# Patient Record
Sex: Female | Born: 1980 | Race: White | Hispanic: No | Marital: Single | Smoking: Never smoker
Health system: Southern US, Community
[De-identification: ages and names within clinical notes are randomized; demographics above are authoritative.]

## PROBLEM LIST (undated history)

## (undated) ENCOUNTER — Inpatient Hospital Stay (HOSPITAL_COMMUNITY): Payer: Self-pay

## (undated) DIAGNOSIS — B009 Herpesviral infection, unspecified: Secondary | ICD-10-CM

## (undated) DIAGNOSIS — Z8759 Personal history of other complications of pregnancy, childbirth and the puerperium: Secondary | ICD-10-CM

## (undated) DIAGNOSIS — O4400 Placenta previa specified as without hemorrhage, unspecified trimester: Secondary | ICD-10-CM

## (undated) DIAGNOSIS — R87629 Unspecified abnormal cytological findings in specimens from vagina: Secondary | ICD-10-CM

## (undated) DIAGNOSIS — N200 Calculus of kidney: Secondary | ICD-10-CM

## (undated) HISTORY — DX: Unspecified abnormal cytological findings in specimens from vagina: R87.629

## (undated) HISTORY — DX: Personal history of other complications of pregnancy, childbirth and the puerperium: Z87.59

## (undated) HISTORY — DX: Herpesviral infection, unspecified: B00.9

## (undated) HISTORY — PX: LEEP: SHX91

## (undated) HISTORY — DX: Calculus of kidney: N20.0

---
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
## 2002-08-01 ENCOUNTER — Ambulatory Visit (HOSPITAL_COMMUNITY): Admission: RE | Admit: 2002-08-01 | Discharge: 2002-08-01 | Payer: Self-pay

## 2002-08-01 ENCOUNTER — Encounter (INDEPENDENT_AMBULATORY_CARE_PROVIDER_SITE_OTHER): Payer: Self-pay

## 2003-01-02 ENCOUNTER — Other Ambulatory Visit: Admission: RE | Admit: 2003-01-02 | Discharge: 2003-01-02 | Payer: Self-pay

## 2003-04-16 ENCOUNTER — Encounter: Admission: RE | Admit: 2003-04-16 | Discharge: 2003-04-16 | Payer: Self-pay | Admitting: Family Medicine

## 2003-05-16 ENCOUNTER — Other Ambulatory Visit: Admission: RE | Admit: 2003-05-16 | Discharge: 2003-05-16 | Payer: Self-pay | Admitting: Obstetrics and Gynecology

## 2003-07-12 ENCOUNTER — Emergency Department (HOSPITAL_COMMUNITY): Admission: EM | Admit: 2003-07-12 | Discharge: 2003-07-12 | Payer: Self-pay | Admitting: Emergency Medicine

## 2003-09-13 ENCOUNTER — Other Ambulatory Visit: Admission: RE | Admit: 2003-09-13 | Discharge: 2003-09-13 | Payer: Self-pay | Admitting: Obstetrics and Gynecology

## 2004-04-28 ENCOUNTER — Other Ambulatory Visit: Admission: RE | Admit: 2004-04-28 | Discharge: 2004-04-28 | Payer: Self-pay | Admitting: Obstetrics and Gynecology

## 2005-04-30 ENCOUNTER — Other Ambulatory Visit: Admission: RE | Admit: 2005-04-30 | Discharge: 2005-04-30 | Payer: Self-pay | Admitting: Obstetrics and Gynecology

## 2006-04-24 ENCOUNTER — Emergency Department (HOSPITAL_COMMUNITY): Admission: EM | Admit: 2006-04-24 | Discharge: 2006-04-24 | Payer: Self-pay | Admitting: Emergency Medicine

## 2006-05-13 ENCOUNTER — Other Ambulatory Visit: Admission: RE | Admit: 2006-05-13 | Discharge: 2006-05-13 | Payer: Self-pay | Admitting: Obstetrics and Gynecology

## 2007-04-29 ENCOUNTER — Other Ambulatory Visit: Admission: RE | Admit: 2007-04-29 | Discharge: 2007-04-29 | Payer: Self-pay | Admitting: Obstetrics and Gynecology

## 2007-12-08 IMAGING — CT CT HEAD W/O CM
1 of 2 series · 16 of 30 positions shown, 20 images · IV contrast (agent unspecified)
Comparison: none

CLINICAL DATA: Fell hitting head with hematoma in the right occipital region. 
 HEAD CT WITHOUT CONTRAST:
TECHNIQUE: Contiguous axial images were obtained from the base of the skull through the vertex according to standard protocol without contrast.

[Series 3: recon 2: brain · axial · 0.47mm/px · z∈[+91,+216]mm · 16 of 56 slices shown, 20 images]
[im 3/56  brain]
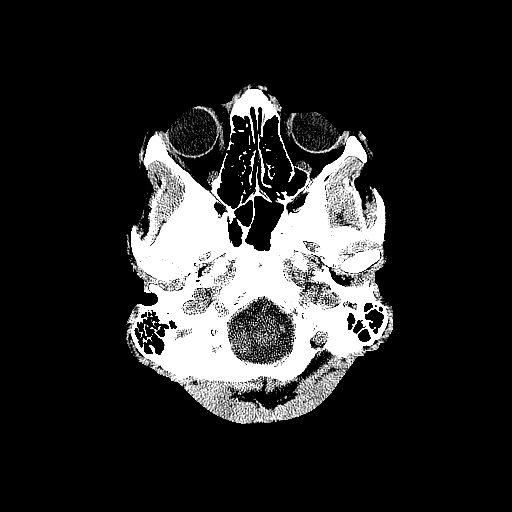
[im 3/56  bone]
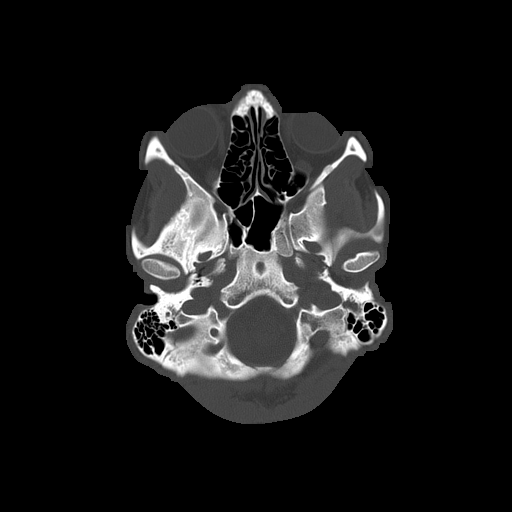
[im 6/56  brain]
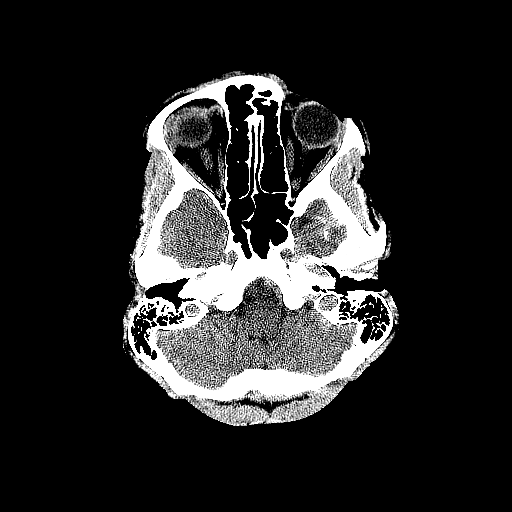
[im 9/56  brain]
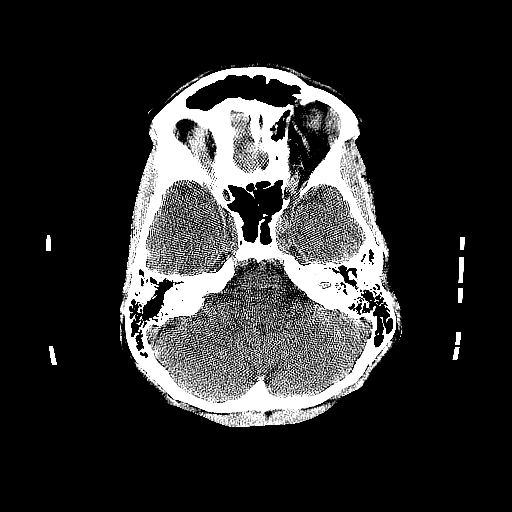
[im 12/56  brain]
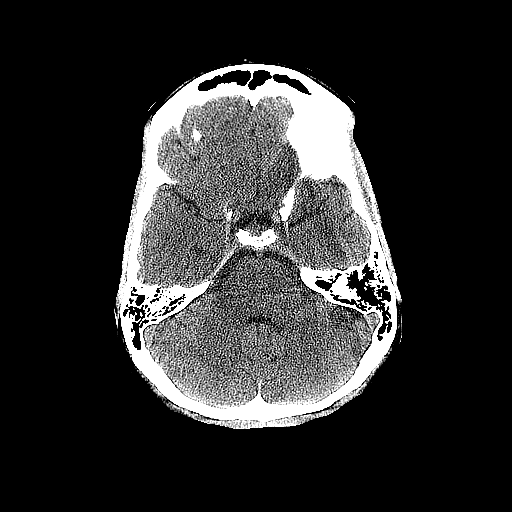
[im 18/56  brain]
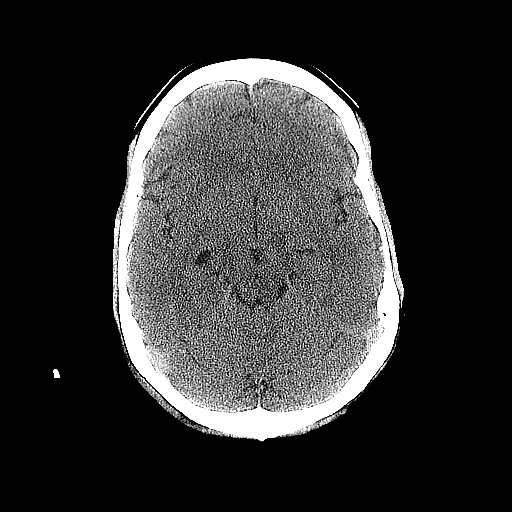
[im 18/56  bone]
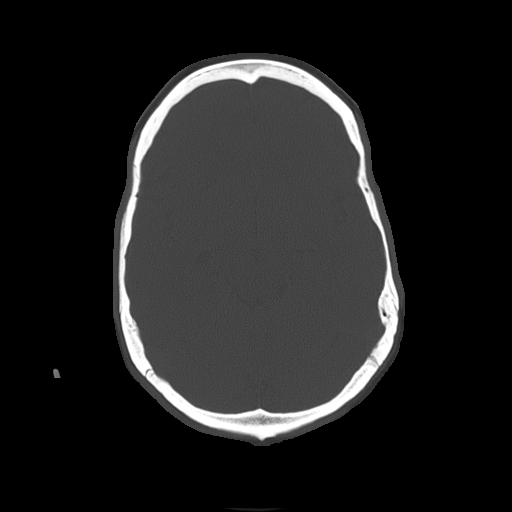
[im 21/56  brain]
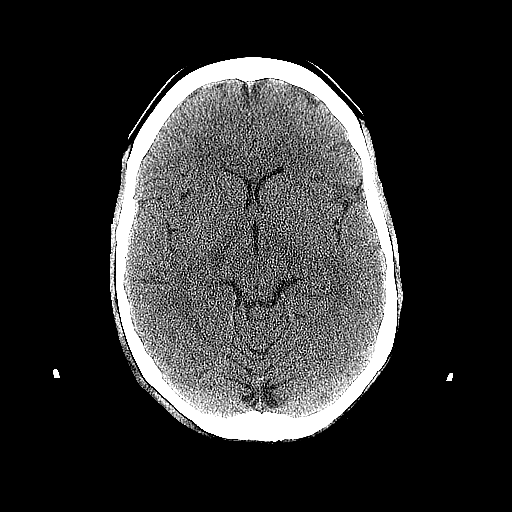
[im 24/56  brain]
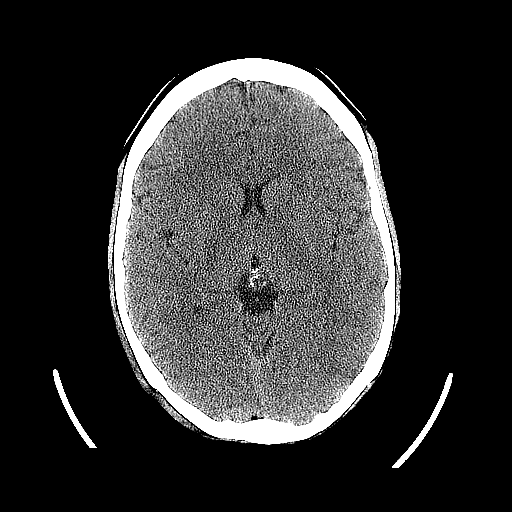
[im 27/56  brain]
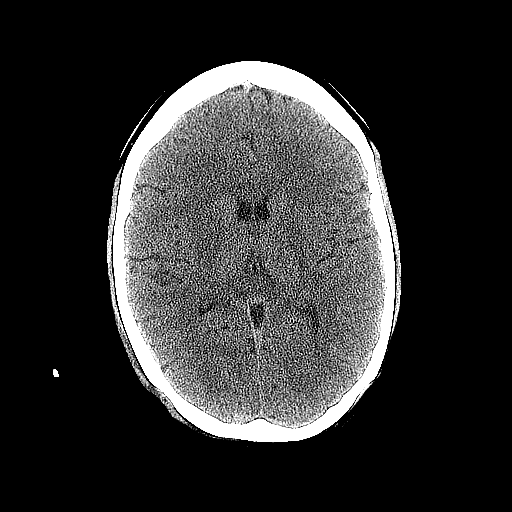
[im 29/56  brain]
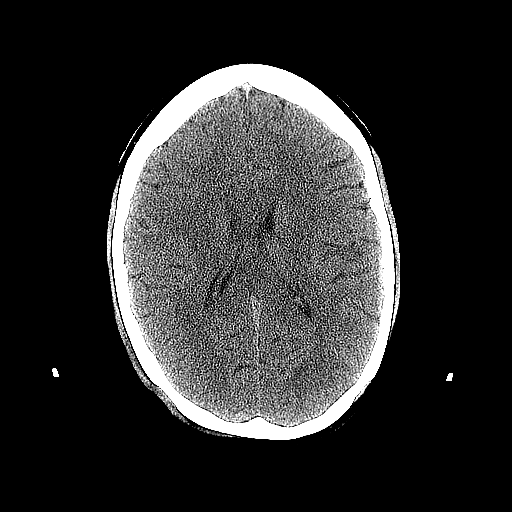
[im 29/56  bone]
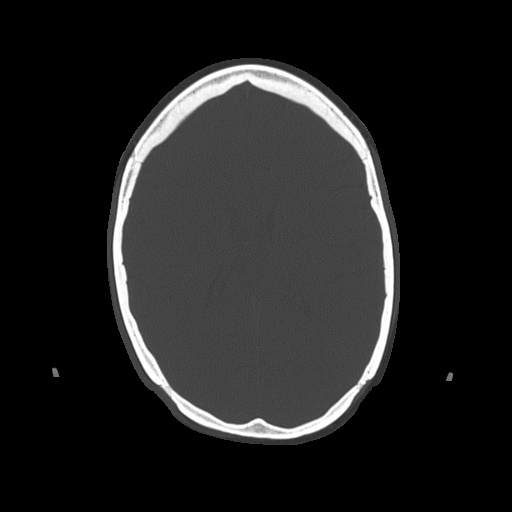
[im 32/56  brain]
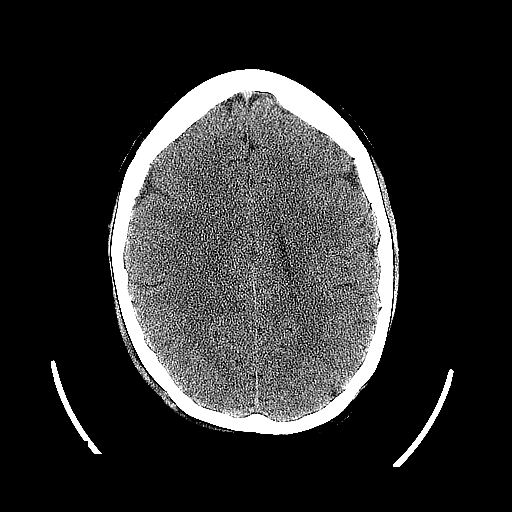
[im 35/56  brain]
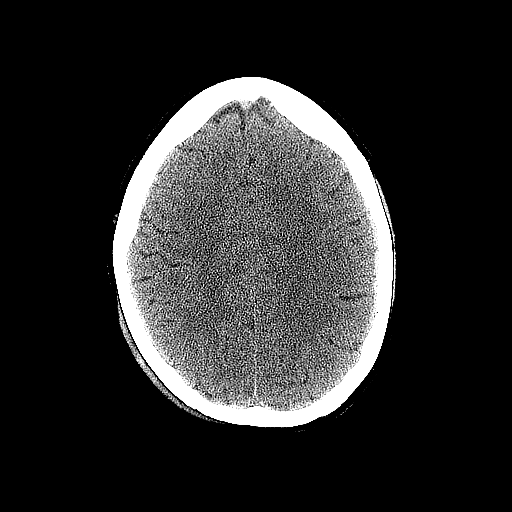
[im 38/56  brain]
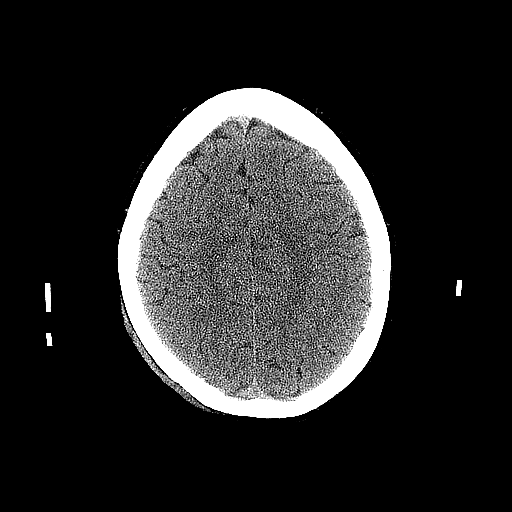
[im 44/56  brain]
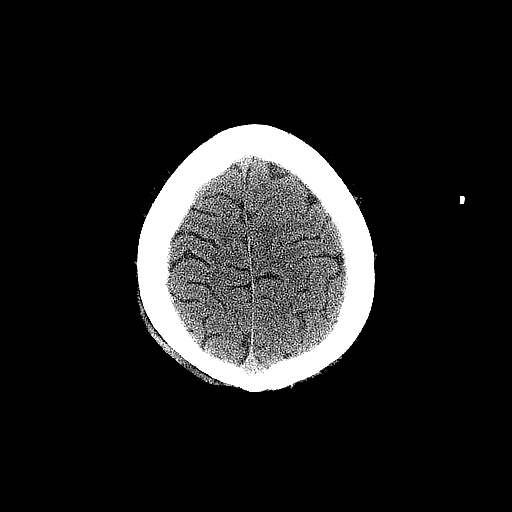
[im 44/56  bone]
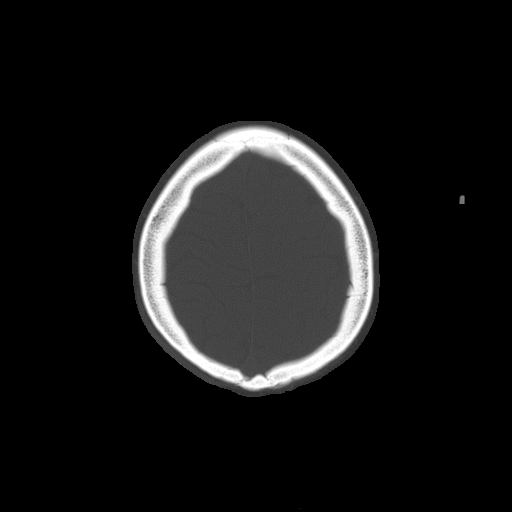
[im 47/56  brain]
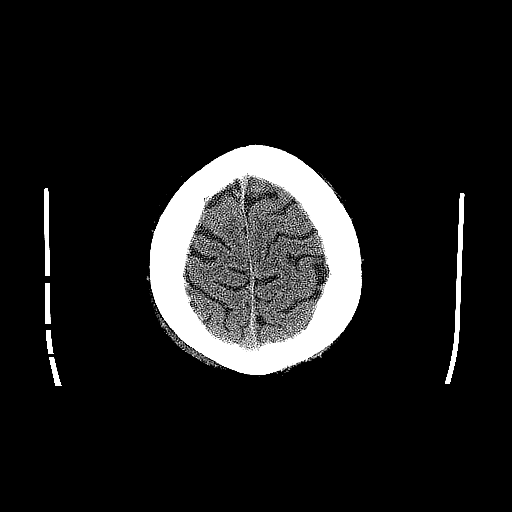
[im 50/56  brain]
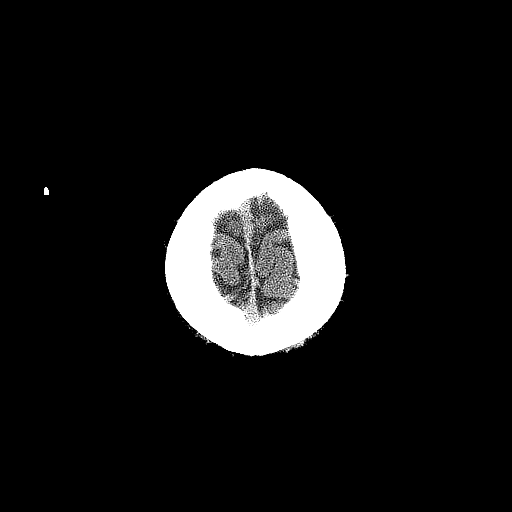
[im 53/56  brain]
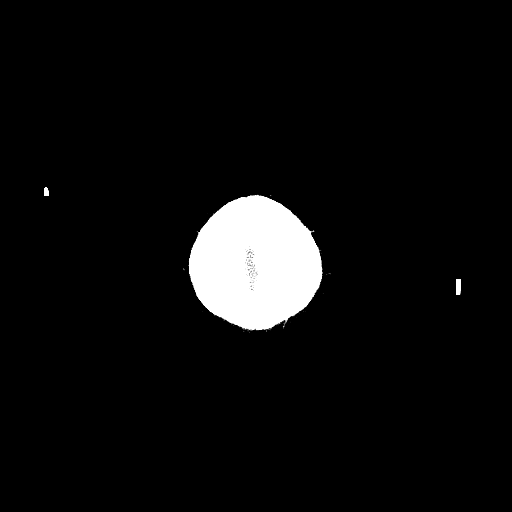

[16 of 30 positions shown; findings below may reference images not displayed]

FINDINGS: The ventricular system is normal in size and configuration and the septum is in a normal midline position. The 4th ventricle and basilar cisterns appear normal. No blood, edema, or mass effect is seen. On bone window images, no bony abnormality is noted.
IMPRESSION: No acute intracranial abnormality.

## 2009-05-03 ENCOUNTER — Other Ambulatory Visit: Admission: RE | Admit: 2009-05-03 | Discharge: 2009-05-03 | Payer: Self-pay | Admitting: Obstetrics and Gynecology

## 2009-11-11 ENCOUNTER — Ambulatory Visit (HOSPITAL_COMMUNITY): Admission: AD | Admit: 2009-11-11 | Discharge: 2009-11-11 | Payer: Self-pay | Admitting: Obstetrics and Gynecology

## 2009-11-15 ENCOUNTER — Inpatient Hospital Stay (HOSPITAL_COMMUNITY): Admission: AD | Admit: 2009-11-15 | Discharge: 2009-11-18 | Payer: Self-pay | Admitting: Obstetrics and Gynecology

## 2009-11-29 ENCOUNTER — Ambulatory Visit: Admission: RE | Admit: 2009-11-29 | Discharge: 2009-11-29 | Payer: Self-pay | Admitting: Obstetrics and Gynecology

## 2010-06-14 ENCOUNTER — Encounter: Payer: Self-pay | Admitting: *Deleted

## 2010-07-17 ENCOUNTER — Other Ambulatory Visit (HOSPITAL_COMMUNITY)
Admission: RE | Admit: 2010-07-17 | Discharge: 2010-07-17 | Disposition: A | Discharge status: Home / Self Care | Payer: Medicaid Other | Source: Ambulatory Visit | Attending: Obstetrics and Gynecology | Admitting: Obstetrics and Gynecology

## 2010-07-17 ENCOUNTER — Other Ambulatory Visit: Payer: Self-pay | Admitting: Obstetrics and Gynecology

## 2010-07-17 DIAGNOSIS — Z01419 Encounter for gynecological examination (general) (routine) without abnormal findings: Secondary | ICD-10-CM | POA: Insufficient documentation

## 2010-07-17 DIAGNOSIS — Z113 Encounter for screening for infections with a predominantly sexual mode of transmission: Secondary | ICD-10-CM | POA: Insufficient documentation

## 2010-08-10 LAB — COMPREHENSIVE METABOLIC PANEL WITH GFR
ALT: 24 U/L (ref 0–35)
AST: 37 U/L (ref 0–37)
Albumin: 2.3 g/dL — ABNORMAL LOW (ref 3.5–5.2)
Alkaline Phosphatase: 275 U/L — ABNORMAL HIGH (ref 39–117)
BUN: 11 mg/dL (ref 6–23)
CO2: 22 meq/L (ref 19–32)
Calcium: 8 mg/dL — ABNORMAL LOW (ref 8.4–10.5)
Chloride: 106 meq/L (ref 96–112)
Creatinine, Ser: 0.98 mg/dL (ref 0.4–1.2)
GFR calc Af Amer: >60 mL/min (ref >60)
GFR calc non Af Amer: >60 mL/min (ref >60)
Glucose, Bld: 81 mg/dL (ref 70–99)
Potassium: 3.9 meq/L (ref 3.5–5.1)
Sodium: 136 meq/L (ref 135–145)
Total Bilirubin: 0.5 mg/dL (ref 0.3–1.2)
Total Protein: 5.6 g/dL — ABNORMAL LOW (ref 6.0–8.3)

## 2010-08-10 LAB — LACTATE DEHYDROGENASE
LDH: 175 U/L (ref 94–250)
LDH: 179 U/L (ref 94–250)

## 2010-08-10 LAB — CBC
HCT: 37.9 % (ref 36.0–46.0)
HCT: 38.1 % (ref 36.0–46.0)
HCT: 38.5 % (ref 36.0–46.0)
HCT: 38.8 % (ref 36.0–46.0)
HCT: 40 % (ref 36.0–46.0)
Hemoglobin: 13.2 g/dL (ref 12.0–15.0)
Hemoglobin: 13.4 g/dL (ref 12.0–15.0)
Hemoglobin: 13.5 g/dL (ref 12.0–15.0)
Hemoglobin: 14.1 g/dL (ref 12.0–15.0)
MCH: 34.2 pg — ABNORMAL HIGH (ref 26.0–34.0)
MCH: 34.3 pg — ABNORMAL HIGH (ref 26.0–34.0)
MCH: 34.5 pg — ABNORMAL HIGH (ref 26.0–34.0)
MCHC: 35 g/dL (ref 30.0–36.0)
MCHC: 35 g/dL (ref 30.0–36.0)
MCHC: 35.2 g/dL (ref 30.0–36.0)
MCHC: 35.3 g/dL (ref 30.0–36.0)
MCV: 96.9 fL (ref 78.0–100.0)
MCV: 97.7 fL (ref 78.0–100.0)
MCV: 97.9 fL (ref 78.0–100.0)
MCV: 98 fL (ref 78.0–100.0)
Platelets: 142 10*3/uL — ABNORMAL LOW (ref 150–400)
Platelets: 144 K/uL — ABNORMAL LOW (ref 150–400)
Platelets: 150 K/uL (ref 150–400)
Platelets: 155 10*3/uL (ref 150–400)
RBC: 3.89 MIL/uL (ref 3.87–5.11)
RBC: 3.89 MIL/uL (ref 3.87–5.11)
RBC: 3.94 MIL/uL (ref 3.87–5.11)
RBC: 4.13 MIL/uL (ref 3.87–5.11)
RDW: 13.4 % (ref 11.5–15.5)
RDW: 13.5 % (ref 11.5–15.5)
RDW: 13.5 % (ref 11.5–15.5)
RDW: 13.6 % (ref 11.5–15.5)
WBC: 12.1 K/uL — ABNORMAL HIGH (ref 4.0–10.5)
WBC: 19.3 10*3/uL — ABNORMAL HIGH (ref 4.0–10.5)
WBC: 22.4 K/uL — ABNORMAL HIGH (ref 4.0–10.5)
WBC: 9.4 10*3/uL (ref 4.0–10.5)

## 2010-08-10 LAB — URINALYSIS, ROUTINE W REFLEX MICROSCOPIC
Bilirubin Urine: NEGATIVE
Glucose, UA: NEGATIVE mg/dL
Ketones, ur: NEGATIVE mg/dL
Leukocytes, UA: NEGATIVE
Nitrite: NEGATIVE
Protein, ur: 30 mg/dL — AB
Specific Gravity, Urine: 1.015 (ref 1.005–1.030)
Urobilinogen, UA: 0.2 mg/dL (ref 0.0–1.0)
pH: 6 (ref 5.0–8.0)

## 2010-08-10 LAB — COMPREHENSIVE METABOLIC PANEL
ALT: 20 U/L (ref 0–35)
AST: 33 U/L (ref 0–37)
Albumin: 2.3 g/dL — ABNORMAL LOW (ref 3.5–5.2)
Albumin: 2.5 g/dL — ABNORMAL LOW (ref 3.5–5.2)
Alkaline Phosphatase: 241 U/L — ABNORMAL HIGH (ref 39–117)
Alkaline Phosphatase: 279 U/L — ABNORMAL HIGH (ref 39–117)
Alkaline Phosphatase: 289 U/L — ABNORMAL HIGH (ref 39–117)
BUN: 11 mg/dL (ref 6–23)
BUN: 5 mg/dL — ABNORMAL LOW (ref 6–23)
CO2: 21 mEq/L (ref 19–32)
CO2: 23 mEq/L (ref 19–32)
CO2: 25 mEq/L (ref 19–32)
Calcium: 9.6 mg/dL (ref 8.4–10.5)
Chloride: 103 mEq/L (ref 96–112)
Chloride: 105 mEq/L (ref 96–112)
Chloride: 106 mEq/L (ref 96–112)
Creatinine, Ser: 1.02 mg/dL (ref 0.4–1.2)
GFR calc Af Amer: >60 mL/min (ref >60)
GFR calc non Af Amer: >60 mL/min (ref >60)
GFR calc non Af Amer: >60 mL/min (ref >60)
GFR calc non Af Amer: >60 mL/min (ref >60)
Glucose, Bld: 82 mg/dL (ref 70–99)
Glucose, Bld: 90 mg/dL (ref 70–99)
Potassium: 4.1 mEq/L (ref 3.5–5.1)
Potassium: 4.3 mEq/L (ref 3.5–5.1)
Sodium: 135 mEq/L (ref 135–145)
Total Bilirubin: 0.5 mg/dL (ref 0.3–1.2)
Total Bilirubin: 0.5 mg/dL (ref 0.3–1.2)
Total Bilirubin: 0.6 mg/dL (ref 0.3–1.2)

## 2010-08-10 LAB — URINE MICROSCOPIC-ADD ON

## 2010-08-10 LAB — MRSA PCR SCREENING: MRSA by PCR: NEGATIVE

## 2010-08-10 LAB — MAGNESIUM
Magnesium: 5 mg/dL — ABNORMAL HIGH (ref 1.5–2.5)
Magnesium: 6.8 mg/dL (ref 1.5–2.5)

## 2010-08-10 LAB — URIC ACID
Uric Acid, Serum: 7.1 mg/dL — ABNORMAL HIGH (ref 2.4–7.0)
Uric Acid, Serum: 7.9 mg/dL — ABNORMAL HIGH (ref 2.4–7.0)

## 2010-10-10 NOTE — Discharge Summary (Signed)
[facility]  Patient:    Tanya Gregory, Tanya Gregory                       MRN: [id number] Adm. Date:  [id number] Disc. Date: [id number] Attending:  Michaelle Copas Dictator:   Pricilla Holm, M.D.                           Discharge Summary  PROCEDURES:                   None.  HISTORY OF PRESENT ILLNESS:   The patient is a ______-year-old female, G1, with unsure last menstrual period and unwanted pregnancy with no prenatal care.  She was found to have premature preterm rupture of membranes two night ago without blood, contractions, or cramps.  She awoke yesterday morning and found that she had a cord protruding through her introitus.  She was seen by ______ this afternoon and received a consult.  She was found to have a black umbilical cord protruding and found to have negative fetal heart tones.  Her exam was unremarkable and she was transferred to [facility] for further care.  HOSPITAL COURSE:              Upon arrival, the patient was admitted for Cytotec induction of labor.  The patient received 200 mg of Cytotec per vagina.  The patient required Stadol and Phenergan for pain.  At approximately 1:45 a.m. on January 07, 2000, the patient felt the urge to push and delivered a nonviable female with placenta delivered intact shortly afterwards at 2:10 a.m.  The estimated blood loss was approximately 300 cc.  The patient tolerated the induction well and was without complications afterwards. Pitocin was used to augment the delivery of the placenta.  The patient rested well for the rest of the morning.  The patient was deemed appropriate and stable to return home this morning.  CONDITION ON DISCHARGE:       Good.  DISPOSITION:                  The patient was discharged to home with her mother.  DISCHARGE MEDICATIONS:        None.  The patient declined any form of birth control at this point and stated that she will be abstinent for the  time being.  If she decides to be sexually active, she will approach this with ______.  DISCHARGE INSTRUCTIONS:       The patient is to call ______ office this afternoon to schedule a medical follow-up appointment for next week.  The patient will also be asking for counseling sources at that time for both herself and her mother.  The mother was found to be very involved in this pregnancy.  The mother has several of her own issues that she is dealing with in times of her early pregnancy and she is dealing with the loss of this child and the grief that her daughter is feeling as well.  ACTIVITY:                     The patient will have unlimited activity.  DIET:                         Normal.  FOLLOW-UP:  As stated above, the patient will schedule an appointment to see ______ at [facility].  The patient and her mother voiced agreement and understanding of the above plan.  They had no further questions. DD:  01/07/00 TD:  01/07/00 Job: [id number]

## 2010-10-10 NOTE — Op Note (Signed)
NAME:  [name], [name] NO.:  [id number]   MEDICAL RECORD NO.:  [id number]                   PATIENT TYPE:  AMB   LOCATION:  SDC                                  FACILITY:  [facility]   PHYSICIAN:  Ronda Fairly. Galen Daft, M.D.              DATE OF BIRTH:  Mar 27, 1981   DATE OF PROCEDURE:  08/01/2002  DATE OF DISCHARGE:                                 OPERATIVE REPORT   PREOPERATIVE DIAGNOSES:  Severe dysplasia.   POSTOPERATIVE DIAGNOSES:  Severe dysplasia.   PROCEDURE:  Loop electrosurgical excision procedure, loop electrosurgical  excision procedure cone.   SURGEON:  Ronda Fairly. Galen Daft, M.D.   ANESTHESIA:  Intravenous sedation with local 1% lidocaine with 1:100,000  epinephrine.   COMPLICATIONS:  None.   ESTIMATED BLOOD LOSS:  Less than 5 mL.   SPECIMENS:  Endocervical cone and exocervical cone, two pieces with suture  at 12 o'clock for both pieces.   PROCEDURE:  The patient was identified as Tanya Gregory.  Prior to the  procedure I reviewed the risks of the procedure, the benefits, and the  alternatives with her and her mother.  The patient understood that there was  a risk of recurrence of this disease as well as a risk of cervical problems  from the procedure itself.  The other alternatives were also reviewed  including the other surgical and nonsurgical alternatives.  She had severe  dysplasia by colposcopic guided biopsy.  The patient had normal  laboratories, negative pregnancy test.  She was brought to the operating  room.  Betadine prep was utilized.  A cervical cone was carried out in a  cowboy hat fashion utilizing the large loop initially followed by the  smaller 1.5 cm loop for the inner cervical cone.  The margins were marked at  12 o'clock with sutures.  There was no active bleeding at the end of the  procedure.  Ball cautery was utilized at the base and as well as at the  exocervix for an ablation technique at the margin.  The procedure  was  tolerated very well.  Monsel's was used prophylactically.  There was no  active bleeding.  The total anesthesia used was 18 mL of 1% lidocaine with  1:100,000 epinephrine in a circumferential fashion.  Care was taken to avoid  intravascular injection.  All instrument, sponge, and needle counts were  correct at the end of the case.  There were no complications.                                               Ronda Fairly. Galen Daft, M.D.   N[facility]T/MEDQ  D:  08/02/2002  T:  08/02/2002  Job:  [id number]   cc:   Gretta Arab. Valentina Lucks, M.D.  [address]  [place]  [place] [address]  Fax: [phone]

## 2011-08-18 ENCOUNTER — Other Ambulatory Visit (HOSPITAL_COMMUNITY)
Admission: RE | Admit: 2011-08-18 | Discharge: 2011-08-18 | Disposition: A | Discharge status: Home / Self Care | Payer: BC Managed Care – PPO | Source: Ambulatory Visit | Attending: Obstetrics and Gynecology | Admitting: Obstetrics and Gynecology

## 2011-08-18 ENCOUNTER — Other Ambulatory Visit: Payer: Self-pay | Admitting: Nurse Practitioner

## 2011-08-18 DIAGNOSIS — N760 Acute vaginitis: Secondary | ICD-10-CM | POA: Insufficient documentation

## 2011-08-18 DIAGNOSIS — Z113 Encounter for screening for infections with a predominantly sexual mode of transmission: Secondary | ICD-10-CM | POA: Insufficient documentation

## 2011-08-18 DIAGNOSIS — Z01419 Encounter for gynecological examination (general) (routine) without abnormal findings: Secondary | ICD-10-CM | POA: Insufficient documentation

## 2012-10-27 ENCOUNTER — Other Ambulatory Visit (HOSPITAL_COMMUNITY)
Admission: RE | Admit: 2012-10-27 | Discharge: 2012-10-27 | Disposition: A | Discharge status: Home / Self Care | Payer: BC Managed Care – PPO | Source: Ambulatory Visit | Attending: Obstetrics and Gynecology | Admitting: Obstetrics and Gynecology

## 2012-10-27 ENCOUNTER — Other Ambulatory Visit: Payer: Self-pay | Admitting: Nurse Practitioner

## 2012-10-27 DIAGNOSIS — N760 Acute vaginitis: Secondary | ICD-10-CM | POA: Insufficient documentation

## 2012-10-27 DIAGNOSIS — Z113 Encounter for screening for infections with a predominantly sexual mode of transmission: Secondary | ICD-10-CM | POA: Insufficient documentation

## 2012-10-27 DIAGNOSIS — Z01419 Encounter for gynecological examination (general) (routine) without abnormal findings: Secondary | ICD-10-CM | POA: Insufficient documentation

## 2013-05-25 DIAGNOSIS — N200 Calculus of kidney: Secondary | ICD-10-CM

## 2013-05-25 HISTORY — DX: Calculus of kidney: N20.0

## 2013-12-12 ENCOUNTER — Other Ambulatory Visit (HOSPITAL_COMMUNITY)
Admission: RE | Admit: 2013-12-12 | Discharge: 2013-12-12 | Disposition: A | Discharge status: Home / Self Care | Payer: BC Managed Care – PPO | Source: Ambulatory Visit | Attending: Nurse Practitioner | Admitting: Nurse Practitioner

## 2013-12-12 ENCOUNTER — Other Ambulatory Visit: Payer: Self-pay | Admitting: Nurse Practitioner

## 2013-12-12 DIAGNOSIS — Z1151 Encounter for screening for human papillomavirus (HPV): Secondary | ICD-10-CM | POA: Insufficient documentation

## 2013-12-12 DIAGNOSIS — Z113 Encounter for screening for infections with a predominantly sexual mode of transmission: Secondary | ICD-10-CM | POA: Insufficient documentation

## 2013-12-12 DIAGNOSIS — Z01419 Encounter for gynecological examination (general) (routine) without abnormal findings: Secondary | ICD-10-CM | POA: Insufficient documentation

## 2013-12-15 LAB — CYTOLOGY - PAP

## 2014-07-30 ENCOUNTER — Other Ambulatory Visit: Payer: Self-pay | Admitting: Obstetrics & Gynecology

## 2014-07-30 ENCOUNTER — Ambulatory Visit
Admission: RE | Admit: 2014-07-30 | Discharge: 2014-07-30 | Disposition: A | Discharge status: Home / Self Care | Payer: BLUE CROSS/BLUE SHIELD | Source: Ambulatory Visit | Attending: Obstetrics & Gynecology | Admitting: Obstetrics & Gynecology

## 2014-07-30 DIAGNOSIS — R109 Unspecified abdominal pain: Secondary | ICD-10-CM

## 2016-05-06 ENCOUNTER — Ambulatory Visit (INDEPENDENT_AMBULATORY_CARE_PROVIDER_SITE_OTHER): Payer: Managed Care, Other (non HMO)

## 2016-05-06 ENCOUNTER — Ambulatory Visit (INDEPENDENT_AMBULATORY_CARE_PROVIDER_SITE_OTHER): Payer: Managed Care, Other (non HMO) | Admitting: Physician Assistant

## 2016-05-06 VITALS — BP 130/80 | HR 80 | Temp 98.4°F | Resp 16 | Ht 65.5 in | Wt 159.6 lb

## 2016-05-06 DIAGNOSIS — R1084 Generalized abdominal pain: Secondary | ICD-10-CM

## 2016-05-06 LAB — POCT CBC
Granulocyte percent: 51.4 %G (ref 37–80)
HCT, POC: 39.7 % (ref 37.7–47.9)
HEMOGLOBIN: 14.2 g/dL (ref 12.2–16.2)
Lymph, poc: 3.5 — AB (ref 0.6–3.4)
MCH: 31.9 pg — AB (ref 27–31.2)
MCHC: 35.9 g/dL — AB (ref 31.8–35.4)
MCV: 88.9 fL (ref 80–97)
MID (CBC): 0.6 (ref 0–0.9)
MPV: 7.7 fL (ref 0–99.8)
PLATELET COUNT, POC: 287 10*3/uL (ref 142–424)
POC Granulocyte: 4.3 (ref 2–6.9)
POC LYMPH PERCENT: 41.6 %L (ref 10–50)
POC MID %: 7 %M (ref 0–12)
RBC: 4.46 M/uL (ref 4.04–5.48)
RDW, POC: 12.4 %
WBC: 8.4 10*3/uL (ref 4.6–10.2)

## 2016-05-06 LAB — POC MICROSCOPIC URINALYSIS (UMFC): Mucus: ABSENT

## 2016-05-06 LAB — POCT URINALYSIS DIP (MANUAL ENTRY)
Bilirubin, UA: NEGATIVE
Glucose, UA: NEGATIVE
Ketones, POC UA: NEGATIVE
Leukocytes, UA: NEGATIVE
NITRITE UA: NEGATIVE
PH UA: 6.5
Protein Ur, POC: NEGATIVE
Spec Grav, UA: 1.01
UROBILINOGEN UA: 0.2

## 2016-05-06 LAB — POCT URINE PREGNANCY: PREG TEST UR: NEGATIVE

## 2016-05-06 NOTE — Patient Instructions (Addendum)
Most likely, the cause of your symptoms is increased stool in the colon. This can cause significant pain that often comes and goes. Increase the fluids you are drinking, and make sure you are getting lots of fiber. Add a stool softener, like Docusate, and consider an osmotic laxative (Miralax). Avoid stimulant laxatives, which can cause more cramping and pain.  Staying active can also help!    IF you received an x-ray today, you will receive an invoice from [facility]. Please contact [facility] at [phone] with questions or concerns regarding your invoice.   IF you received labwork today, you will receive an invoice from [facility]. Please contact [facility] at [phone] with questions or concerns regarding your invoice.   Our billing staff will not be able to assist you with questions regarding bills from these companies.  You will be contacted with the lab results as soon as they are available. The fastest way to get your results is to activate your My Chart account. Instructions are located on the last page of this paperwork. If you have not heard from us regarding the results in 2 weeks, please contact this office.

## 2016-05-06 NOTE — Progress Notes (Signed)
Patient ID: Tanya Gregory, female     DOB: 03-04-1981, 35 y.o.    MRN: [id number]  PCP: No primary care provider on file.  Chief Complaint  Patient presents with  . Abdominal Pain    x 4 days - vomiting Monday  . Constipation    Subjective:   This patient is new to this practice and presents for evaluation of abdominal pain and constipation. She is accompanied by her daughter.   Sunday (05/03/2016) evening when going to bed, about 8:30pm, developed pain in the center of her abdomen. She went on to sleep. Describes pain as a tight squeezing. The next morning the pain was excruciating (8/10) and lasted all day, and again on Tuesday. She is able to sleep, but awakens periodically with pain. This morning she awoke about 4 am with the urge to defecate. Had a very small BM and noted blood on the TP. Again about 5 am. Pain was not as bad this morning, but then excruciating at lunch time. Chest feels a little tight. Nausea. Vomited twice on Monday (05/04/2016). Nausea is not as bad today. Some associated pain in the back. No fever, chills. No headache or dizziness. Pain does resolve completely intermittently. When pain occurs, it can last 20-60 minutes. No aggravating factors. Ibuprofen has helped some. Sleeping with a heating pad helps some. No hematuria. No urinary urgency, frequency or burning. No changes in diet.  No recent travel. Normal menses last week. Not currently sexually active.   Review of Systems As above  Prior to Admission medications   Not on File     No Known Allergies   There are no active problems to display for this patient.    No family history on file.   Social History   Social History  . Marital status: Single    Spouse name: N/A  . Number of children: 1  . Years of education: N/A   Occupational History  . teacher    Social History Main Topics  . Smoking status: Never Smoker  . Smokeless tobacco: Never Used  . Alcohol use  Not on file     Comment: every now and then  . Drug use: No  . Sexual activity: Not on file   Other Topics Concern  . Not on file   Social History Narrative   Lives her daughter Jerrel Ivory(Gabrielle).   Smoke detectors in home? Yes    Guns in home? No    Consistent seatbelt use? Yes    Consistent dental brushing and flossing? Yes    Semi-Annual dental visits: Yes    Annual Eye exams: No             Objective:  Physical Exam  Constitutional: She is oriented to person, place, and time. She appears well-developed and well-nourished. She is active and cooperative. No distress.  BP 130/80 (BP Location: Right Arm, Patient Position: Sitting, Cuff Size: Normal)   Pulse 80   Temp 98.4 F (36.9 C) (Oral)   Resp 16   Ht 5' 5.5" (1.664 m)   Wt 159 lb 9.6 oz (72.4 kg)   LMP 04/28/2016   SpO2 99%   BMI 26.15 kg/m   HENT:  Head: Normocephalic and atraumatic.  Right Ear: Hearing normal.  Left Ear: Hearing normal.  Eyes: Conjunctivae are normal. No scleral icterus.  Neck: Normal range of motion. Neck supple. No thyromegaly present.  Cardiovascular: Normal rate, regular rhythm and normal heart sounds.   Pulses:  Radial pulses are 2+ on the right side, and 2+ on the left side.  Pulmonary/Chest: Effort normal and breath sounds normal.  Abdominal: Normal appearance and bowel sounds are normal. There is no hepatosplenomegaly. There is tenderness in the right upper quadrant, epigastric area, periumbilical area, suprapubic area, left upper quadrant and left lower quadrant. There is no rigidity, no rebound, no guarding, no CVA tenderness, no tenderness at McBurney's point and negative Murphy's sign.  Lymphadenopathy:       Head (right side): No tonsillar, no preauricular, no posterior auricular and no occipital adenopathy present.       Head (left side): No tonsillar, no preauricular, no posterior auricular and no occipital adenopathy present.    She has no cervical adenopathy.       Right: No  supraclavicular adenopathy present.       Left: No supraclavicular adenopathy present.  Neurological: She is alert and oriented to person, place, and time. No sensory deficit.  Skin: Skin is warm, dry and intact. No rash noted. No cyanosis or erythema. Nails show no clubbing.  Psychiatric: She has a normal mood and affect. Her speech is normal and behavior is normal.      Dg Abd Acute W/chest  Result Date: 05/06/2016 CLINICAL DATA:  Abdominal pain and constipation. EXAM: DG ABDOMEN ACUTE W/ 1V CHEST COMPARISON:  Abdominal CT 07/30/2014 FINDINGS: Both lungs are clear. Normal appearance of the heart and mediastinum. Negative for free air. Normal bowel gas pattern. Moderate amount of stool along the right side of the abdomen and pelvis. Bone structures appear normal. IMPRESSION: Moderate stool burden. No acute cardiopulmonary disease. Electronically Signed   By: Richarda OverlieAdam  Henn M.D.   On: 05/06/2016 17:01     Results for orders placed or performed in visit on 05/06/16  POCT CBC  Result Value Ref Range   WBC 8.4 4.6 - 10.2 K/uL   Lymph, poc 3.5 (A) 0.6 - 3.4   POC LYMPH PERCENT 41.6 10 - 50 %L   MID (cbc) 0.6 0 - 0.9   POC MID % 7.0 0 - 12 %M   POC Granulocyte 4.3 2 - 6.9   Granulocyte percent 51.4 37 - 80 %G   RBC 4.46 4.04 - 5.48 M/uL   Hemoglobin 14.2 12.2 - 16.2 g/dL   HCT, POC 16.139.7 09.637.7 - 47.9 %   MCV 88.9 80 - 97 fL   MCH, POC 31.9 (A) 27 - 31.2 pg   MCHC 35.9 (A) 31.8 - 35.4 g/dL   RDW, POC 04.512.4 %   Platelet Count, POC 287 142 - 424 K/uL   MPV 7.7 0 - 99.8 fL  POCT urinalysis dipstick  Result Value Ref Range   Color, UA yellow yellow   Clarity, UA clear clear   Glucose, UA negative negative   Bilirubin, UA negative negative   Ketones, POC UA negative negative   Spec Grav, UA 1.010    Blood, UA trace-intact (A) negative   pH, UA 6.5    Protein Ur, POC negative negative   Urobilinogen, UA 0.2    Nitrite, UA Negative Negative   Leukocytes, UA Negative Negative  POCT  Microscopic Urinalysis ([facility])  Result Value Ref Range   WBC,UR,HPF,POC None None WBC/hpf   RBC,UR,HPF,POC None None RBC/hpf   Bacteria None None, Too numerous to count   Mucus Absent Absent   Epithelial Cells, UR Per Microscopy Few (A) None, Too numerous to count cells/hpf  POCT urine pregnancy  Result Value Ref Range  Preg Test, Ur Negative Negative        Assessment & Plan:  1. Generalized abdominal pain Reassuring labs and radiographs. Likely due to constipation. Supportive care with increased oral hydration, dietary fiber and physical activity. Try Miralax. RTC if symptoms worsen/persist. - POCT CBC - POCT urinalysis dipstick - POCT Microscopic Urinalysis ([facility]) - POCT urine pregnancy - DG Abd Acute W/Chest; Future   Fernande Bras, PA-C Physician Assistant-Certified [facility]

## 2016-08-30 ENCOUNTER — Inpatient Hospital Stay (HOSPITAL_COMMUNITY): Payer: Managed Care, Other (non HMO) | Admitting: Anesthesiology

## 2016-08-30 ENCOUNTER — Encounter (HOSPITAL_COMMUNITY): Payer: Self-pay

## 2016-08-30 ENCOUNTER — Ambulatory Visit (HOSPITAL_COMMUNITY)
Admission: AD | Admit: 2016-08-30 | Discharge: 2016-08-30 | Disposition: A | Discharge status: Home / Self Care | Payer: Managed Care, Other (non HMO) | Source: Ambulatory Visit | Attending: Obstetrics & Gynecology | Admitting: Obstetrics & Gynecology

## 2016-08-30 ENCOUNTER — Encounter (HOSPITAL_COMMUNITY)
Admission: AD | Disposition: A | Discharge status: Home / Self Care | Payer: Self-pay | Source: Ambulatory Visit | Attending: Obstetrics & Gynecology

## 2016-08-30 ENCOUNTER — Inpatient Hospital Stay (HOSPITAL_COMMUNITY): Payer: Managed Care, Other (non HMO)

## 2016-08-30 DIAGNOSIS — Z9889 Other specified postprocedural states: Secondary | ICD-10-CM | POA: Diagnosis not present

## 2016-08-30 DIAGNOSIS — O26899 Other specified pregnancy related conditions, unspecified trimester: Secondary | ICD-10-CM | POA: Diagnosis not present

## 2016-08-30 DIAGNOSIS — R109 Unspecified abdominal pain: Secondary | ICD-10-CM | POA: Diagnosis not present

## 2016-08-30 DIAGNOSIS — N838 Other noninflammatory disorders of ovary, fallopian tube and broad ligament: Secondary | ICD-10-CM | POA: Insufficient documentation

## 2016-08-30 DIAGNOSIS — Z3A01 Less than 8 weeks gestation of pregnancy: Secondary | ICD-10-CM | POA: Diagnosis not present

## 2016-08-30 DIAGNOSIS — O009 Unspecified ectopic pregnancy without intrauterine pregnancy: Secondary | ICD-10-CM | POA: Insufficient documentation

## 2016-08-30 DIAGNOSIS — Z87442 Personal history of urinary calculi: Secondary | ICD-10-CM | POA: Insufficient documentation

## 2016-08-30 HISTORY — PX: DIAGNOSTIC LAPAROSCOPY WITH REMOVAL OF ECTOPIC PREGNANCY: SHX6449

## 2016-08-30 LAB — TYPE AND SCREEN
ABO/RH(D): A POS
Antibody Screen: NEGATIVE

## 2016-08-30 LAB — CBC
HEMATOCRIT: 30.3 % — AB (ref 36.0–46.0)
Hemoglobin: 11.2 g/dL — ABNORMAL LOW (ref 12.0–15.0)
MCH: 31.4 pg (ref 26.0–34.0)
MCHC: 37 g/dL — ABNORMAL HIGH (ref 30.0–36.0)
MCV: 84.9 fL (ref 78.0–100.0)
PLATELETS: 261 10*3/uL (ref 150–400)
RBC: 3.57 MIL/uL — AB (ref 3.87–5.11)
RDW: 12.5 % (ref 11.5–15.5)
WBC: 14.8 10*3/uL — AB (ref 4.0–10.5)

## 2016-08-30 LAB — ABO/RH: ABO/RH(D): A POS

## 2016-08-30 LAB — HCG, QUANTITATIVE, PREGNANCY: HCG, BETA CHAIN, QUANT, S: 3567 m[IU]/mL — AB (ref <5)

## 2016-08-30 SURGERY — Surgical Case
Anesthesia: *Unknown

## 2016-08-30 SURGERY — LAPAROSCOPY, WITH ECTOPIC PREGNANCY SURGICAL TREATMENT
Anesthesia: General | Site: Abdomen

## 2016-08-30 MED ORDER — PROMETHAZINE HCL 25 MG/ML IJ SOLN: 12.5000 mg | Freq: Once | INTRAMUSCULAR | Status: DC

## 2016-08-30 MED ORDER — LACTATED RINGERS IV BOLUS (SEPSIS)
1000.0000 mL | Freq: Once | INTRAVENOUS | Status: AC
  Administered 2016-08-30: 1000 mL via INTRAVENOUS

## 2016-08-30 MED ORDER — BUPIVACAINE HCL (PF) 0.25 % IJ SOLN
INTRAMUSCULAR | Status: DC | PRN
  Administered 2016-08-30: 30 mL

## 2016-08-30 MED ORDER — PROMETHAZINE HCL 25 MG/ML IJ SOLN
6.2500 mg | INTRAMUSCULAR | Status: DC | PRN
Start: 2016-08-30 — End: 2016-08-30

## 2016-08-30 MED ORDER — DOCUSATE SODIUM 100 MG PO CAPS: 100.0000 mg | ORAL_CAPSULE | Freq: Two times a day (BID) | ORAL | 0 refills | Status: DC

## 2016-08-30 MED ORDER — SUGAMMADEX SODIUM 200 MG/2ML IV SOLN: INTRAVENOUS | Status: DC | PRN

## 2016-08-30 MED ORDER — MIDAZOLAM HCL 2 MG/2ML IJ SOLN
INTRAMUSCULAR | Status: DC | PRN
  Administered 2016-08-30: 2 mg via INTRAVENOUS

## 2016-08-30 MED ORDER — ROCURONIUM BROMIDE 100 MG/10ML IV SOLN
INTRAVENOUS | Status: AC
  Filled 2016-08-30: qty 1

## 2016-08-30 MED ORDER — FENTANYL CITRATE (PF) 100 MCG/2ML IJ SOLN
INTRAMUSCULAR | Status: AC
  Filled 2016-08-30: qty 2

## 2016-08-30 MED ORDER — OXYCODONE-ACETAMINOPHEN 5-325 MG PO TABS
ORAL_TABLET | ORAL | Status: AC
  Administered 2016-08-30: 1
  Filled 2016-08-30: qty 1

## 2016-08-30 MED ORDER — LIDOCAINE HCL (CARDIAC) 20 MG/ML IV SOLN
INTRAVENOUS | Status: AC
  Filled 2016-08-30: qty 5

## 2016-08-30 MED ORDER — FENTANYL CITRATE (PF) 250 MCG/5ML IJ SOLN
INTRAMUSCULAR | Status: AC
  Filled 2016-08-30: qty 5

## 2016-08-30 MED ORDER — HYDROMORPHONE HCL 1 MG/ML IJ SOLN: 0.2500 mg | INTRAMUSCULAR | Status: DC | PRN

## 2016-08-30 MED ORDER — PROPOFOL 10 MG/ML IV BOLUS
INTRAVENOUS | Status: DC | PRN
  Administered 2016-08-30: 150 mg via INTRAVENOUS

## 2016-08-30 MED ORDER — SUGAMMADEX SODIUM 200 MG/2ML IV SOLN
INTRAVENOUS | Status: DC | PRN
  Administered 2016-08-30: 144 mg via INTRAVENOUS

## 2016-08-30 MED ORDER — FENTANYL CITRATE (PF) 100 MCG/2ML IJ SOLN
INTRAMUSCULAR | Status: DC | PRN
  Administered 2016-08-30: 50 ug via INTRAVENOUS
  Administered 2016-08-30 (×2): 100 ug via INTRAVENOUS
  Administered 2016-08-30 (×2): 50 ug via INTRAVENOUS

## 2016-08-30 MED ORDER — LIDOCAINE HCL (CARDIAC) 20 MG/ML IV SOLN
INTRAVENOUS | Status: DC | PRN
  Administered 2016-08-30: 100 mg via INTRAVENOUS

## 2016-08-30 MED ORDER — IBUPROFEN 600 MG PO TABS: 600.0000 mg | ORAL_TABLET | Freq: Four times a day (QID) | ORAL | 0 refills | Status: DC | PRN

## 2016-08-30 MED ORDER — ONDANSETRON HCL 4 MG/2ML IJ SOLN
INTRAMUSCULAR | Status: DC | PRN
  Administered 2016-08-30: 4 mg via INTRAVENOUS

## 2016-08-30 MED ORDER — MIDAZOLAM HCL 2 MG/2ML IJ SOLN
INTRAMUSCULAR | Status: AC
  Filled 2016-08-30: qty 2

## 2016-08-30 MED ORDER — ROCURONIUM BROMIDE 100 MG/10ML IV SOLN
INTRAVENOUS | Status: DC | PRN
  Administered 2016-08-30: 30 mg via INTRAVENOUS

## 2016-08-30 MED ORDER — LACTATED RINGERS IR SOLN
Status: DC | PRN
  Administered 2016-08-30: 3000 mL

## 2016-08-30 MED ORDER — OXYCODONE-ACETAMINOPHEN 5-325 MG PO TABS: 1.0000 | ORAL_TABLET | Freq: Four times a day (QID) | ORAL | 0 refills | Status: DC | PRN

## 2016-08-30 MED ORDER — LACTATED RINGERS IV SOLN
INTRAVENOUS | Status: DC | PRN
  Administered 2016-08-30 (×2): via INTRAVENOUS

## 2016-08-30 MED ORDER — SUCCINYLCHOLINE CHLORIDE 20 MG/ML IJ SOLN
INTRAMUSCULAR | Status: DC | PRN
  Administered 2016-08-30: 100 mg via INTRAVENOUS

## 2016-08-30 MED ORDER — SUGAMMADEX SODIUM 200 MG/2ML IV SOLN
INTRAVENOUS | Status: AC
  Filled 2016-08-30: qty 4

## 2016-08-30 MED ORDER — ONDANSETRON HCL 4 MG/2ML IJ SOLN
INTRAMUSCULAR | Status: AC
  Filled 2016-08-30: qty 2

## 2016-08-30 MED ORDER — DEXAMETHASONE SODIUM PHOSPHATE 10 MG/ML IJ SOLN
INTRAMUSCULAR | Status: DC | PRN
  Administered 2016-08-30: 10 mg via INTRAVENOUS

## 2016-08-30 MED ORDER — PROPOFOL 10 MG/ML IV BOLUS
INTRAVENOUS | Status: AC
  Filled 2016-08-30: qty 40

## 2016-08-30 MED ORDER — DEXAMETHASONE SODIUM PHOSPHATE 10 MG/ML IJ SOLN
INTRAMUSCULAR | Status: AC
  Filled 2016-08-30: qty 1

## 2016-08-30 MED ORDER — HYDROMORPHONE HCL 1 MG/ML IJ SOLN
1.0000 mg | Freq: Once | INTRAMUSCULAR | Status: AC
  Administered 2016-08-30: 1 mg via INTRAVENOUS
  Filled 2016-08-30: qty 1

## 2016-08-30 SURGICAL SUPPLY — 23 items
ADH SKN CLS APL DERMABOND .7 (GAUZE/BANDAGES/DRESSINGS) ×2
BAG SPEC RTRVL LRG 6X4 10 (ENDOMECHANICALS) ×2
DERMABOND ADVANCED (GAUZE/BANDAGES/DRESSINGS) ×2
DERMABOND ADVANCED .7 DNX12 (GAUZE/BANDAGES/DRESSINGS) ×1 IMPLANT
DRSG OPSITE POSTOP 3X4 (GAUZE/BANDAGES/DRESSINGS) ×3 IMPLANT
ELECT REM PT RETURN 9FT ADLT (ELECTROSURGICAL) ×4
ELECTRODE REM PT RTRN 9FT ADLT (ELECTROSURGICAL) ×1 IMPLANT
GLOVE BIO SURGEON STRL SZ 6.5 (GLOVE) ×6 IMPLANT
GLOVE BIO SURGEONS STRL SZ 6.5 (GLOVE) ×3
GLOVE BIOGEL M 6.5 STRL (GLOVE) ×6 IMPLANT
GLOVE BIOGEL PI IND STRL 7.0 (GLOVE) ×2 IMPLANT
GLOVE BIOGEL PI INDICATOR 7.0 (GLOVE) ×4
PACK LAPAROSCOPY BASIN (CUSTOM PROCEDURE TRAY) ×3 IMPLANT
PACK WEDGE TRENDGUARD 450 PROC (MISCELLANEOUS) ×1 IMPLANT
PAD OB MATERNITY 4.3X12.25 (PERSONAL CARE ITEMS) ×3 IMPLANT
PORT ACCESS TROCAR AIRSEAL 5 (TROCAR) ×6 IMPLANT
POUCH SPECIMEN RETRIEVAL 10MM (ENDOMECHANICALS) ×3 IMPLANT
SET IRRIG TUBING LAPAROSCOPIC (IRRIGATION / IRRIGATOR) ×3 IMPLANT
SHEARS HARMONIC ACE PLUS 36CM (ENDOMECHANICALS) ×3 IMPLANT
TOWEL OR 17X24 6PK STRL BLUE (TOWEL DISPOSABLE) ×6 IMPLANT
TRENDGUARD 450 WEDGE PROC PACK (MISCELLANEOUS) ×4
TROCAR XCEL NON-BLD 11X100MML (ENDOMECHANICALS) ×3 IMPLANT
WARMER LAPAROSCOPE (MISCELLANEOUS) ×3 IMPLANT

## 2016-08-30 NOTE — Discharge Instructions (Addendum)
HOME INSTRUCTIONS  Please note any unusual or excessive bleeding, pain, swelling. Mild dizziness or drowsiness are normal for about 24 hours after surgery.   Shower when comfortable  Restrictions: No driving for 24 hours AND while taking pain medications (percocet).  Activity:  No heavy lifting (> 10 lbs), nothing in vagina (no tampons, douching, or intercourse) x 4 weeks; no tub baths for 4 weeks Vaginal spotting is expected but if your bleeding is heavy, period like,  please call the office   Incision: the honeycomb dressing on your belly button can be removed in 2-3 days.   The purple dermabond will fall off when they are ready to; you may clean your incision with mild soap and water but do not rub or scrub the incision site.  You may experience slight bloody drainage from your incision periodically.  This is normal.  If you experience a large amount of drainage or the incision opens, please call your physician who will likely direct you to the emergency department.  Diet:  You may return to your regular diet.  Do not eat large meals.  Eat small frequent meals throughout the day.  Continue to drink a good amount of water at least 6-8 glasses of water per day, hydration is very important for the healing process.  Pain Management: Take Motrin and/or Percocet as prescribed/needed for pain.  Percocet may cause constipation, so please take Colace (stool softener) twice daily while on this medication.    Always take prescription pain medication with food, it may cause constipation, increase fluids and fiber and you may want to use an over-the-counter stool softener or other constipation medicine if needed.  Alcohol -- Avoid for 24 hours and while taking pain medications.  Nausea: Take sips of ginger ale or soda  Fever -- Call physician if temperature over 101 degrees  Follow up:  Please make an office follow up appointment in 2 weeks, please call the office at [phone].  If you experience  fever (a temperature greater than 100.4), pain unrelieved by pain medication, shortness of breath, swelling of a single leg, or any other symptoms which are concerning to you please the office immediately.  General Anesthesia, Adult, Care After These instructions provide you with information about caring for yourself after your procedure. Your health care provider may also give you more specific instructions. Your treatment has been planned according to current medical practices, but problems sometimes occur. Call your health care provider if you have any problems or questions after your procedure. What can I expect after the procedure? After the procedure, it is common to have:  Vomiting.  A sore throat.  Mental slowness. It is common to feel:  Nauseous.  Cold or shivery.  Sleepy.  Tired.  Sore or achy, even in parts of your body where you did not have surgery. Follow these instructions at home: For at least 24 hours after the procedure:   Do not:  Participate in activities where you could fall or become injured.  Drive.  Use heavy machinery.  Drink alcohol.  Take sleeping pills or medicines that cause drowsiness.  Make important decisions or sign legal documents.  Take care of children on your own.  Rest. Eating and drinking   If you vomit, drink water, juice, or soup when you can drink without vomiting.  Drink enough fluid to keep your urine clear or pale yellow.  Make sure you have little or no nausea before eating solid foods.  Follow the diet recommended  by your health care provider. General instructions   Have a responsible adult stay with you until you are awake and alert.  Return to your normal activities as told by your health care provider. Ask your health care provider what activities are safe for you.  Take over-the-counter and prescription medicines only as told by your health care provider.  If you smoke, do not smoke without  supervision.  Keep all follow-up visits as told by your health care provider. This is important. Contact a health care provider if:  You continue to have nausea or vomiting at home, and medicines are not helpful.  You cannot drink fluids or start eating again.  You cannot urinate after 8-12 hours.  You develop a skin rash.  You have fever.  You have increasing redness at the site of your procedure. Get help right away if:  You have difficulty breathing.  You have chest pain.  You have unexpected bleeding.  You feel that you are having a life-threatening or urgent problem. This information is not intended to replace advice given to you by your health care provider. Make sure you discuss any questions you have with your health care provider. Document Released: 08/17/2000 Document Revised: 10/14/2015 Document Reviewed: 04/25/2015 Elsevier Interactive Patient Education  2017 [employer].

## 2016-08-30 NOTE — MAU Provider Note (Signed)
History     CSN: [id number]  Arrival date and time: 08/30/16 0145   None     Chief Complaint  Patient presents with  . Abdominal Pain   HPI Tanya Gregory is a 36 y.o. [id number] at [redacted]w[redacted]d by LMP who presents via EMS for abdominal pain. Has positive pregnancy test earlier this week. Abdominal pain started around 11 pm tonight. Reports lower abdominal pain that she describes as cramp-like & radiates to right shoulder. Rates pain 4/10. Pain worse with movement & lying supine. Has not treated pain. Denies fever, n/v, vaginal bleeding.   OB History    Gravida Para Term Preterm AB Living   SAB TAB Ectopic Multiple Live Births   Obstetric Comments   1st SAB at 4 months      Past Medical History:  Diagnosis Date  . Nephrolithiasis 2015    Past Surgical History:  Procedure Laterality Date  . LEEP  age 19    No family history on file.  Social History  Substance Use Topics  . Smoking status: Never Smoker  . Smokeless tobacco: Never Used  . Alcohol use Not on file     Comment: every now and then    Allergies: No Known Allergies  No prescriptions prior to admission.    Review of Systems  Constitutional: Negative.   Gastrointestinal: Positive for abdominal distention and abdominal pain. Negative for constipation, diarrhea, nausea and vomiting.  Genitourinary: Negative for vaginal bleeding and vaginal discharge.  Musculoskeletal: Positive for back pain.       + right shoulder pain   Physical Exam   Blood pressure 118/77, pulse 83, temperature 97.9 F (36.6 C), temperature source Oral, resp. rate 16, last menstrual period 07/25/2016, SpO2 99 %.  Physical Exam  Nursing note and vitals reviewed. Constitutional: She is oriented to person, place, and time. She appears well-developed and well-nourished. She appears distressed.  HENT:  Head: Normocephalic and atraumatic.  Eyes: Conjunctivae are normal. Right eye exhibits no discharge. Left eye  exhibits no discharge. No scleral icterus.  Neck: Normal range of motion.  Cardiovascular: Normal rate, regular rhythm and normal heart sounds.   No murmur heard. Respiratory: Effort normal and breath sounds normal. No respiratory distress. She has no wheezes.  GI: Soft. Bowel sounds are decreased. There is tenderness in the suprapubic area and left lower quadrant. There is guarding. There is no rigidity and no rebound.  Neurological: She is alert and oriented to person, place, and time.  Skin: Skin is warm. She is diaphoretic. There is pallor.  Psychiatric: She has a normal mood and affect. Her behavior is normal. Judgment and thought content normal.    MAU Course  Procedures Results for orders placed or performed during the hospital encounter of 08/30/16 (from the past 24 hour(s))  CBC     Status: Abnormal   Collection Time: 08/30/16  2:10 AM  Result Value Ref Range   WBC 14.8 (H) 4.0 - 10.5 K/uL   RBC 3.57 (L) 3.87 - 5.11 MIL/uL   Hemoglobin 11.2 (L) 12.0 - 15.0 g/dL   HCT 14.7 (L) 82.9 - 56.2 %   MCV 84.9 78.0 - 100.0 fL   MCH 31.4 26.0 - 34.0 pg   MCHC 37.0 (H) 30.0 - 36.0 g/dL   RDW 13.0 86.5 - 78.4 %   Platelets 261 150 - 400 K/uL  Type  and screen [facility] [place]     Status: None   Collection Time: 08/30/16  2:10 AM  Result Value Ref Range   ABO/RH(D) A POS    Antibody Screen NEG    Sample Expiration 09/02/2016     MDM Pt unable to tolerate lying back & yelling out in pain when being transferred from EMS stretcher to bed. IV fluids & dilaudid 1 mg ordered so we can proceed with exam CBC, bhcg, type & screen Stat ultrasound called to bedside Dr. Charlotta Newton called -- informed of pt's arrival & concern for ectopic pregnancy. She is en route to hospital.   Assessment and Plan  Suspected ectopic pregnancy Labs & ultrasound pending Dr. Charlotta Newton at bedside to speak with patient about plan of care  [name] 08/30/2016, 1:52 AM

## 2016-08-30 NOTE — Op Note (Signed)
Preoperative diagnosis: Ruptured ectopic pregnancy  Postoperative diagnosis: Left ectopic pregnancy, right paratubal cyst  Anesthesia: General  Procedure: Diagnostic laparoscopy, evacuation of hemoperitoneum, partial left salpingectomy with removal of ectopic, removal of right paratubal cyst  Surgeon: Dr. Myna Hidalgo  Assistant: Sherre Scarlet, CNM  Estimated blood loss: 50cc Hemoperitoneum: 600cc UOP:  300 IVF: 1200cc  Specimen: tubal pregnancy sent to pathology and right paratubal cyst  Procedure: After being informed of the planned procedure with possible complications including bleeding, infection, injury to other organs, possible salpingectomy,possible laparotomy,  informed consent is obtained and the patient is taken to OR # 4. She is placed in lithotomy position, prepped and draped in a sterile fashion, and a Foley catheter is inserted in her bladder.  A speculum is inserted in the vagina and the anterior lip of the cervix was grasped with tenaculum forcep. An Hulka manipulator was easily positioned and the speculum removed.  The infraumbilical area was injected with 10 cc of Marcaine 0.25 and a skin incision was made with the scalpel.  The Veress needle was inserted, saline dropped test was performed, opening pressure was .   Pneumoperitoneum was obtained.  The Veress was removed and the 11mm trocar was inserted under direct visualization.  Two additional 5 mm trocars under direct visualization were placed after infiltrating each side with 10 cc of Marcaine 0.25 %, one in the right lower quadrant and one in the left lower quadrant.  Inspection of the abdomen was perform, ~600cc of old dark blood with clots noted in the abdomen.  Evacuation of the blood was performed.  Right ovary and fallopian tube were examined and appeared normal except for a a <1cm paratubal ovarian cyst.  Left ovary appeared normal, left ovary was slight enlarged at the distal portion and active  bleeding was noted at the fimbrae.  The harmonic was used to ligate the distal portion of the fallopian tube containing the ectopic pregnancy.  The specimen was placed in a laparoscopic bag and removed in its entirety.  Excellent hemostasis was obtained.  Attention was turned to the right side and the paratubal cyst was removed with the harmonic.  Irrigation and evacuation was performed.  Abdomen was inspected and hemostasis was achieved.  All instruments were removed.  The air was allowed to fully escape from the abdomen.  The fascia of the umbilical incision was closed using 0 Vicryl. The infraumbilical skin incision was closed with subcuticular suture of 4-0 Monocryl and all skin sites were closed with Dermabond. Attention was turned vaginally, the hulka was removed.  Hemostasis was obtained with silver nitrate. Instrument and sponge count were complete x2. The procedure was well tolerated by the patient and she was taken to recovery room in stable condition.      Myna Hidalgo, DO [phone] (pager) [phone] (office)

## 2016-08-30 NOTE — MAU Note (Signed)
Low abd pain since 11:30 crampy pain went down into left leg to knee and went away but when standing, it would come back.  Right shoulder pain started at 8:30pm, comes and goes and started with collarbone.  Tried heating pad.  Then radiated in to chest and her abd would tightning up.  It could be from a 1 on pain scale to excruiating.  No bleeding. Took home pregnancy test, positive on Wednesday.

## 2016-08-30 NOTE — Anesthesia Procedure Notes (Signed)
Procedure Name: Intubation Date/Time: 08/30/2016 3:41 AM Performed by: Rica Records Pre-anesthesia Checklist: Patient identified, Emergency Drugs available, Suction available and Patient being monitored Patient Re-evaluated:Patient Re-evaluated prior to inductionOxygen Delivery Method: Circle system utilized Preoxygenation: Pre-oxygenation with 100% oxygen Intubation Type: IV induction, Cricoid Pressure applied and Rapid sequence Laryngoscope Size: Miller and 2 Grade View: Grade I Number of attempts: 1 Airway Equipment and Method: Stylet Placement Confirmation: ETT inserted through vocal cords under direct vision,  positive ETCO2 and breath sounds checked- equal and bilateral Secured at: 21 cm Tube secured with: Tape Dental Injury: Teeth and Oropharynx as per pre-operative assessment

## 2016-08-30 NOTE — Anesthesia Preprocedure Evaluation (Signed)
Anesthesia Evaluation  Patient identified by MRN, date of birth, ID band Patient awake    Reviewed: Allergy & Precautions, NPO status , Patient's Chart, lab work & pertinent test results  Airway Mallampati: II  TM Distance: >3 FB Neck ROM: Full    Dental  (+) Dental Advisory Given   Pulmonary neg pulmonary ROS,    breath sounds clear to auscultation       Cardiovascular negative cardio ROS   Rhythm:Regular Rate:Normal     Neuro/Psych negative neurological ROS     GI/Hepatic negative GI ROS, Neg liver ROS,   Endo/Other  negative endocrine ROS  Renal/GU negative Renal ROS     Musculoskeletal   Abdominal   Peds  Hematology  (+) anemia ,   Anesthesia Other Findings   Reproductive/Obstetrics Ruptured ectopic pregnancy                             Lab Results  Component Value Date   WBC 14.8 (H) 08/30/2016   HGB 11.2 (L) 08/30/2016   HCT 30.3 (L) 08/30/2016   MCV 84.9 08/30/2016   PLT 261 08/30/2016   Lab Results  Component Value Date   CREATININE 0.98 11/17/2009   BUN 5 (L) 11/17/2009   NA 138 11/17/2009   K 4.3 11/17/2009   CL 103 11/17/2009   CO2 25 11/17/2009    Anesthesia Physical Anesthesia Plan  ASA: II and emergent  Anesthesia Plan: General   Post-op Pain Management:    Induction: Intravenous, Rapid sequence and Cricoid pressure planned  Airway Management Planned: Oral ETT  Additional Equipment:   Intra-op Plan:   Post-operative Plan: Extubation in OR  Informed Consent: I have reviewed the patients History and Physical, chart, labs and discussed the procedure including the risks, benefits and alternatives for the proposed anesthesia with the patient or authorized representative who has indicated his/her understanding and acceptance.   Dental advisory given  Plan Discussed with: CRNA  Anesthesia Plan Comments:         Anesthesia Quick Evaluation

## 2016-08-30 NOTE — Transfer of Care (Signed)
Immediate Anesthesia Transfer of Care Note  Patient: Tanya Gregory  Procedure(s) Performed: Procedure(s): DIAGNOSTIC LAPAROSCOPYLEFT SALPINGECTOMY WITH REMOVAL OF ECTOPIC PREGNANCY  Patient Location: PACU  Anesthesia Type:General  Level of Consciousness: awake, alert  and oriented  Airway & Oxygen Therapy: Patient Spontanous Breathing and Patient connected to nasal cannula oxygen  Post-op Assessment: Report given to RN and Post -op Vital signs reviewed and stable  Post vital signs: Reviewed and stable  Last Vitals:  Vitals:   08/30/16 0250 08/30/16 0315  BP: 107/72 110/78  Pulse: 89 85  Resp: 16 16  Temp:  36.7 C    Last Pain:  Vitals:   08/30/16 0315  TempSrc: Oral  PainSc:       Patients Stated Pain Goal: 0 (08/30/16 0151)  Complications: No apparent anesthesia complications

## 2016-08-30 NOTE — H&P (Signed)
Tanya Gregory is a 36 y.o. [phone] at [redacted]w[redacted]d by LMP who presents via EMS for abdominal pain. She had called in earlier with abdominal pain that has gotten progressively worse throughout the day.  Pt had a positive pregnancy test earlier this week. The pan was worse this evening around 11pm, which prompted her to come to the ER as she also had pain radiate to her shoulder and worse with movement.  Denies any vaginal bleeding.  Some nausea, no fever, +chills.  She took some tylenol around 6pm, which didn't seem to make the pain any better.  Last meal 2pm.            OB History    Gravida Para Term Preterm AB Living   SAB TAB Ectopic Multiple Live Births   Obstetric Comments   1st SAB at 4 months      Past Medical History:  Diagnosis Date  . Nephrolithiasis 2015         Past Surgical History:  Procedure Laterality Date  . LEEP  age 89    No family history on file.        Social History  Substance Use Topics  . Smoking status: Never Smoker  . Smokeless tobacco: Never Used  . Alcohol use Not on file     Comment: every now and then    Allergies: No Known Allergies  No prescriptions prior to admission.    Review of Systems  Constitutional: Negative.   +SOB due to shoulder pain Gastrointestinal: Positive for abdominal distention and abdominal pain. Negative for constipation, diarrhea, nausea and vomiting.  Genitourinary: Negative for vaginal bleeding and vaginal discharge.  Musculoskeletal: Positive for back pain.       Physical Exam   BP 118/77   Pulse 83   Temp 97.9 F (36.6 C) (Oral)   Resp 16   LMP 07/25/2016 Comment: states positive home UPT.  SpO2 99%    Physical Exam   Constitutional:  She appears well-developed and well-nourished. She appears distressed.  Head: Normocephalic and atraumatic.  Neck: Unremarkable Cardiovascular: Normal rate, regular rhythm and normal heart sounds.   No murmur  heard. Respiratory: Effort normal and breath sounds normal. No respiratory distress. She has no wheezes.  GI: Soft. Bowel sounds are decreased. There is tenderness in the suprapubic area and left lower quadrant. There is guarding. There is no rigidity and no rebound.  GU: Deferred due to discomfort Neurological: She is alert and oriented to person, place, and time.  Extremities: no calf tenderness, no edema Skin: Skin is warm. She is diaphoretic. There is pallor.  Psychiatric: She has a normal mood and affect. Her behavior is normal. Judgment and thought content normal.   Results for orders placed or performed during the hospital encounter of 08/30/16 (from the past 24 hour(s))  CBC     Status: Abnormal   Collection Time: 08/30/16  2:10 AM  Result Value Ref Range   WBC 14.8 (H) 4.0 - 10.5 K/uL   RBC 3.57 (L) 3.87 - 5.11 MIL/uL   Hemoglobin 11.2 (L) 12.0 - 15.0 g/dL   HCT 45.4 (L) 09.8 - 11.9 %   MCV 84.9 78.0 - 100.0 fL   MCH 31.4 26.0 - 34.0 pg   MCHC 37.0 (H) 30.0 - 36.0 g/dL   RDW 14.7 82.9 - 56.2 %   Platelets 261 150 -  400 K/uL  hCG, quantitative, pregnancy     Status: Abnormal   Collection Time: 08/30/16  2:10 AM  Result Value Ref Range   hCG, Beta Chain, Quant, S 3,567 (H) <5 mIU/mL  Type and screen [facility] [place] [place]     Status: None   Collection Time: 08/30/16  2:10 AM  Result Value Ref Range   ABO/RH(D) A POS    Antibody Screen NEG    Sample Expiration 09/02/2016      [place]: Called directly from radiology- significant free fluid noted in pelvis consistent with blood. Though difficult to tell appears as though left ectopic- final report pending.  A/P: 35yo [id number] @ [redacted]w[redacted]d with suspected ruptured ectopic -NPO -LR @ 125cc/hr -SCDs to OR -Discussed US findings- reviewed plan for Diagnostic laparoscopy, removal of ectopic and possible salpingectomy.  Discussed potential future effects on fertility and removed risk of potential scar tissue and recurrence of  ectopic if salpingostomy performed.  Also discussed risk of bleeding, infection, injury to surrounding organs and needs for laparotomy.  Questions and concerns were addressed and pt wishes to proceed.  Myna Hidalgo, DO [phone] (pager) [phone] (office)

## 2016-08-30 NOTE — Progress Notes (Signed)
Phenergan ordered due to thought pt was getting nauseated after Dilaudid but pt ended up saying it was "just the hiccups" and didn't want any nausea med. Phenergan not given and notified Erin NP.

## 2016-08-30 NOTE — MAU Note (Signed)
Dr Sampson Goon notified of pt's admission and status. Aware Dr Charlotta Newton coming in for pt due to ruptured ectopic and plans for OR. Aware of NPO status. No preop meds ordered.

## 2016-08-31 ENCOUNTER — Encounter (HOSPITAL_COMMUNITY): Payer: Self-pay | Admitting: Obstetrics & Gynecology

## 2016-08-31 NOTE — Anesthesia Postprocedure Evaluation (Signed)
Anesthesia Post Note  Patient: Tanya Gregory  Procedure(s) Performed: Procedure(s): DIAGNOSTIC LAPAROSCOPYLEFT SALPINGECTOMY WITH REMOVAL OF ECTOPIC PREGNANCY  Patient location during evaluation: PACU Anesthesia Type: General Level of consciousness: awake and alert Pain management: pain level controlled Vital Signs Assessment: post-procedure vital signs reviewed and stable Respiratory status: spontaneous breathing, nonlabored ventilation, respiratory function stable and patient connected to nasal cannula oxygen Cardiovascular status: blood pressure returned to baseline and stable Postop Assessment: no signs of nausea or vomiting Anesthetic complications: no        Last Vitals:  Vitals:   08/30/16 0600 08/30/16 0630  BP: 110/70 124/78  Pulse: 99 98  Resp: 19 20  Temp: 36.9 C     Last Pain:  Vitals:   08/30/16 0630  TempSrc:   PainSc: 4    Pain Goal: Patients Stated Pain Goal: 3 (08/30/16 0500)               Kennieth Rad

## 2017-01-07 LAB — OB RESULTS CONSOLE RPR: RPR: NONREACTIVE

## 2017-01-07 LAB — OB RESULTS CONSOLE GC/CHLAMYDIA
Chlamydia: NEGATIVE
Gonorrhea: NEGATIVE

## 2017-01-07 LAB — OB RESULTS CONSOLE ABO/RH: RH TYPE: POSITIVE

## 2017-01-07 LAB — OB RESULTS CONSOLE HIV ANTIBODY (ROUTINE TESTING): HIV: NONREACTIVE

## 2017-01-07 LAB — OB RESULTS CONSOLE RUBELLA ANTIBODY, IGM: RUBELLA: IMMUNE

## 2017-01-07 LAB — OB RESULTS CONSOLE HEPATITIS B SURFACE ANTIGEN: Hepatitis B Surface Ag: NEGATIVE

## 2017-01-07 LAB — OB RESULTS CONSOLE ANTIBODY SCREEN: Antibody Screen: NEGATIVE

## 2017-01-20 ENCOUNTER — Encounter (HOSPITAL_COMMUNITY): Payer: Self-pay

## 2017-05-25 NOTE — L&D Delivery Note (Signed)
Late entry for 3/11 Delivery Note At 1:53 PM a viable female was delivered via Vaginal, Spontaneous (Presentation: OA).  APGAR: 9, 9; weight 7 lb 9 oz (3430 g).   Placenta status: to L&D Cord:  with the following complications: none .  Cord pH: n/a  Anesthesia:  epidural Episiotomy: None Lacerations: 2nd degree Suture Repair: 2.0 3.0 vicryl Est. Blood Loss (mL):    Mom to postpartum.  Baby to Couplet care / Skin to Skin.  Alessandra BevelsJennifer M Stephens [place] 08/07/2017, 2:04 PM

## 2017-07-24 ENCOUNTER — Encounter (HOSPITAL_COMMUNITY): Payer: Self-pay

## 2017-07-27 ENCOUNTER — Telehealth (HOSPITAL_COMMUNITY): Payer: Self-pay | Admitting: *Deleted

## 2017-07-27 ENCOUNTER — Encounter (HOSPITAL_COMMUNITY): Payer: Self-pay | Admitting: *Deleted

## 2017-07-27 LAB — OB RESULTS CONSOLE GBS: GBS: NEGATIVE

## 2017-07-27 NOTE — Telephone Encounter (Signed)
Preadmission screen  

## 2017-07-28 ENCOUNTER — Encounter (HOSPITAL_COMMUNITY): Payer: Self-pay | Admitting: *Deleted

## 2017-07-28 ENCOUNTER — Telehealth (HOSPITAL_COMMUNITY): Payer: Self-pay | Admitting: *Deleted

## 2017-07-28 NOTE — Telephone Encounter (Signed)
Preadmission screen  

## 2017-08-01 ENCOUNTER — Other Ambulatory Visit: Payer: Self-pay | Admitting: Obstetrics & Gynecology

## 2017-08-01 NOTE — H&P (Signed)
HPI: 37 y/o G5P1031 @ 3673w1d estimated gestational age (as dated by LMP c/w 20 week ultrasound) presents for scheduled IOL.   no Leaking of Fluid,   No Vaginal Bleeding,   + Uterine Contractions,  + Fetal Movement.  Prenatal care has been provided by Dr. Charlotta Newtonzan  ROS: no HA, no epigastric pain, no visual changes.    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, no lesions noted on recent exam   Prenatal Transfer Tool  Maternal Diabetes: No Genetic Screening: Normal Maternal Ultrasounds/Referrals: Normal Fetal Ultrasounds or other Referrals:  None Maternal Substance Abuse:  No Significant Maternal Medications:  Meds include: Other:  valtrex, zantac Significant Maternal Lab Results: Lab values include: Group B Strep negative   PNL:  GBS neg, Rub Immune, Hep B neg, RPR NR, HIV neg, GC/C neg, glucola:131 Hgb 12.2 Blood type: A pos  Immunizations: Tdap: declined Flu: declined  OBHx: 2011- FTNSVD @ 37wk due to preeclampsia-6#1 oz -demise @ 18wks -TAB x 1 ecotpic x1 requiring surgical intervention PMHx:  HSV2, GERD Meds:  PNV, zantac, valtrex Allergy:  No Known Allergies SurgHx: diagnostic laparoscopy with salpingectomy/removal of ectopic pregnancy SocHx:   no Tobacco, no  EtOH, no Illicit Drugs  O: LMP 11/01/2016   Examination in office 07/29/2017 Gen. AAOx3, NAD CV.  RRR  No murmur.  Resp. CTAB, no wheeze or crackles. Abd. Gravid,  no tenderness,  no rigidity,  no guarding GU: No external or internal HSV lesions noted SVE: 1-2/25/-3, vertex on exam Extr.  3+ LE edema B/L , no calf tenderness, neg Homan's B/L  FHT: 140 bpm by doppler   Labs: see orders  A/P:  37 y.o. [id number] @ 2973w1d EGA who presents for elective IOL -FWB:  Reassuring by doppler -Labor: plan for cervical ripening with cytotec -GBS: neg -HSV2: no symptoms or lesions noted on exam -Pain management: IV or  epidural upon requst  Myna HidalgoJennifer Manav Pierotti, DO [phone] (cell) [phone] (office)

## 2017-08-02 ENCOUNTER — Encounter (HOSPITAL_COMMUNITY): Payer: Self-pay

## 2017-08-02 ENCOUNTER — Inpatient Hospital Stay (HOSPITAL_COMMUNITY): Payer: Managed Care, Other (non HMO) | Admitting: Anesthesiology

## 2017-08-02 ENCOUNTER — Inpatient Hospital Stay (HOSPITAL_COMMUNITY)
Admission: RE | Admit: 2017-08-02 | Discharge: 2017-08-03 | DRG: 807 | Disposition: A | Discharge status: Home / Self Care | Payer: Managed Care, Other (non HMO) | Source: Ambulatory Visit | Attending: Obstetrics & Gynecology | Admitting: Obstetrics & Gynecology

## 2017-08-02 DIAGNOSIS — A6000 Herpesviral infection of urogenital system, unspecified: Secondary | ICD-10-CM | POA: Diagnosis present

## 2017-08-02 DIAGNOSIS — K219 Gastro-esophageal reflux disease without esophagitis: Secondary | ICD-10-CM | POA: Diagnosis present

## 2017-08-02 DIAGNOSIS — O9962 Diseases of the digestive system complicating childbirth: Secondary | ICD-10-CM | POA: Diagnosis present

## 2017-08-02 DIAGNOSIS — O26893 Other specified pregnancy related conditions, third trimester: Secondary | ICD-10-CM | POA: Diagnosis present

## 2017-08-02 DIAGNOSIS — Z3A39 39 weeks gestation of pregnancy: Secondary | ICD-10-CM | POA: Diagnosis not present

## 2017-08-02 DIAGNOSIS — O9832 Other infections with a predominantly sexual mode of transmission complicating childbirth: Secondary | ICD-10-CM | POA: Diagnosis present

## 2017-08-02 DIAGNOSIS — R030 Elevated blood-pressure reading, without diagnosis of hypertension: Secondary | ICD-10-CM | POA: Diagnosis present

## 2017-08-02 DIAGNOSIS — Z3483 Encounter for supervision of other normal pregnancy, third trimester: Secondary | ICD-10-CM | POA: Diagnosis present

## 2017-08-02 DIAGNOSIS — Z3493 Encounter for supervision of normal pregnancy, unspecified, third trimester: Secondary | ICD-10-CM

## 2017-08-02 LAB — RPR: RPR Ser Ql: NONREACTIVE

## 2017-08-02 LAB — CBC
HEMATOCRIT: 36.9 % (ref 36.0–46.0)
HEMATOCRIT: 36.7 % (ref 36.0–46.0)
HEMOGLOBIN: 12.8 g/dL (ref 12.0–15.0)
HEMOGLOBIN: 12.9 g/dL (ref 12.0–15.0)
MCH: 31.4 pg (ref 26.0–34.0)
MCH: 31.6 pg (ref 26.0–34.0)
MCHC: 34.7 g/dL (ref 30.0–36.0)
MCHC: 35.1 g/dL (ref 30.0–36.0)
MCV: 90.7 fL (ref 78.0–100.0)
MCV: 90 fL (ref 78.0–100.0)
Platelets: 209 10*3/uL (ref 150–400)
Platelets: 211 10*3/uL (ref 150–400)
RBC: 4.07 MIL/uL (ref 3.87–5.11)
RBC: 4.08 MIL/uL (ref 3.87–5.11)
RDW: 13.5 % (ref 11.5–15.5)
RDW: 13.5 % (ref 11.5–15.5)
WBC: 12.1 10*3/uL — ABNORMAL HIGH (ref 4.0–10.5)
WBC: 13.9 10*3/uL — ABNORMAL HIGH (ref 4.0–10.5)

## 2017-08-02 LAB — COMPREHENSIVE METABOLIC PANEL
ALK PHOS: 265 U/L — AB (ref 38–126)
ALT: 13 U/L — ABNORMAL LOW (ref 14–54)
ANION GAP: 11 (ref 5–15)
AST: 21 U/L (ref 15–41)
Albumin: 2.7 g/dL — ABNORMAL LOW (ref 3.5–5.0)
BILIRUBIN TOTAL: 0.8 mg/dL (ref 0.3–1.2)
BUN: 9 mg/dL (ref 6–20)
CALCIUM: 8.9 mg/dL (ref 8.9–10.3)
CO2: 19 mmol/L — ABNORMAL LOW (ref 22–32)
Chloride: 102 mmol/L (ref 101–111)
Creatinine, Ser: 0.65 mg/dL (ref 0.44–1.00)
GLUCOSE: 76 mg/dL (ref 65–99)
POTASSIUM: 3.8 mmol/L (ref 3.5–5.1)
Sodium: 132 mmol/L — ABNORMAL LOW (ref 135–145)
TOTAL PROTEIN: 6.3 g/dL — AB (ref 6.5–8.1)

## 2017-08-02 LAB — TYPE AND SCREEN
ABO/RH(D): A POS
ANTIBODY SCREEN: NEGATIVE

## 2017-08-02 LAB — PROTEIN / CREATININE RATIO, URINE
CREATININE, URINE: 81 mg/dL
PROTEIN CREATININE RATIO: 0.17 mg/mg{creat} — AB (ref 0.00–0.15)
TOTAL PROTEIN, URINE: 14 mg/dL

## 2017-08-02 MED ORDER — OXYCODONE-ACETAMINOPHEN 5-325 MG PO TABS: 2.0000 | ORAL_TABLET | ORAL | Status: DC | PRN

## 2017-08-02 MED ORDER — ONDANSETRON HCL 4 MG PO TABS: 4.0000 mg | ORAL_TABLET | ORAL | Status: DC | PRN

## 2017-08-02 MED ORDER — ONDANSETRON HCL 4 MG/2ML IJ SOLN: 4.0000 mg | INTRAMUSCULAR | Status: DC | PRN

## 2017-08-02 MED ORDER — MISOPROSTOL 50MCG HALF TABLET
50.0000 ug | ORAL_TABLET | ORAL | Status: DC | PRN
  Administered 2017-08-02: 50 ug via ORAL
  Filled 2017-08-02 (×3): qty 1

## 2017-08-02 MED ORDER — LIDOCAINE HCL (PF) 1 % IJ SOLN
30.0000 mL | INTRAMUSCULAR | Status: DC | PRN
  Filled 2017-08-02: qty 30

## 2017-08-02 MED ORDER — TERBUTALINE SULFATE 1 MG/ML IJ SOLN
0.2500 mg | Freq: Once | INTRAMUSCULAR | Status: DC | PRN
  Filled 2017-08-02: qty 1

## 2017-08-02 MED ORDER — OXYTOCIN 40 UNITS IN LACTATED RINGERS INFUSION - SIMPLE MED
2.5000 [IU]/h | INTRAVENOUS | Status: DC
  Filled 2017-08-02: qty 1000

## 2017-08-02 MED ORDER — LACTATED RINGERS IV SOLN
500.0000 mL | INTRAVENOUS | Status: DC | PRN
Start: 2017-08-02 — End: 2017-08-02

## 2017-08-02 MED ORDER — PHENYLEPHRINE 40 MCG/ML (10ML) SYRINGE FOR IV PUSH (FOR BLOOD PRESSURE SUPPORT)
80.0000 ug | PREFILLED_SYRINGE | INTRAVENOUS | Status: DC | PRN
  Filled 2017-08-02: qty 5

## 2017-08-02 MED ORDER — WITCH HAZEL-GLYCERIN EX PADS: 1.0000 "application " | MEDICATED_PAD | CUTANEOUS | Status: DC | PRN

## 2017-08-02 MED ORDER — ONDANSETRON HCL 4 MG/2ML IJ SOLN: 4.0000 mg | Freq: Four times a day (QID) | INTRAMUSCULAR | Status: DC | PRN

## 2017-08-02 MED ORDER — DIPHENHYDRAMINE HCL 50 MG/ML IJ SOLN: 12.5000 mg | INTRAMUSCULAR | Status: DC | PRN

## 2017-08-02 MED ORDER — SIMETHICONE 80 MG PO CHEW: 80.0000 mg | CHEWABLE_TABLET | ORAL | Status: DC | PRN

## 2017-08-02 MED ORDER — EPHEDRINE 5 MG/ML INJ
10.0000 mg | INTRAVENOUS | Status: DC | PRN
Start: 2017-08-02 — End: 2017-08-02
  Filled 2017-08-02: qty 2

## 2017-08-02 MED ORDER — PRENATAL MULTIVITAMIN CH: 1.0000 | ORAL_TABLET | Freq: Every day | ORAL | Status: DC

## 2017-08-02 MED ORDER — OXYCODONE-ACETAMINOPHEN 5-325 MG PO TABS: 1.0000 | ORAL_TABLET | ORAL | Status: DC | PRN

## 2017-08-02 MED ORDER — BENZOCAINE-MENTHOL 20-0.5 % EX AERO
1.0000 "application " | INHALATION_SPRAY | CUTANEOUS | Status: DC | PRN
  Administered 2017-08-02: 1 via TOPICAL
  Filled 2017-08-02: qty 56

## 2017-08-02 MED ORDER — OXYTOCIN 40 UNITS IN LACTATED RINGERS INFUSION - SIMPLE MED
1.0000 m[IU]/min | INTRAVENOUS | Status: DC
  Administered 2017-08-02: 2 m[IU]/min via INTRAVENOUS

## 2017-08-02 MED ORDER — ZOLPIDEM TARTRATE 5 MG PO TABS: 5.0000 mg | ORAL_TABLET | Freq: Every evening | ORAL | Status: DC | PRN

## 2017-08-02 MED ORDER — DIPHENHYDRAMINE HCL 25 MG PO CAPS: 25.0000 mg | ORAL_CAPSULE | Freq: Four times a day (QID) | ORAL | Status: DC | PRN

## 2017-08-02 MED ORDER — DIBUCAINE 1 % RE OINT: 1.0000 "application " | TOPICAL_OINTMENT | RECTAL | Status: DC | PRN

## 2017-08-02 MED ORDER — FENTANYL CITRATE (PF) 100 MCG/2ML IJ SOLN: 50.0000 ug | INTRAMUSCULAR | Status: DC | PRN

## 2017-08-02 MED ORDER — OXYTOCIN BOLUS FROM INFUSION
500.0000 mL | Freq: Once | INTRAVENOUS | Status: AC
  Administered 2017-08-02: 500 mL via INTRAVENOUS

## 2017-08-02 MED ORDER — FENTANYL 2.5 MCG/ML BUPIVACAINE 1/10 % EPIDURAL INFUSION (WH - ANES)
14.0000 mL/h | INTRAMUSCULAR | Status: DC | PRN
  Administered 2017-08-02: 14 mL/h via EPIDURAL
  Filled 2017-08-02: qty 100

## 2017-08-02 MED ORDER — ACETAMINOPHEN 325 MG PO TABS: 650.0000 mg | ORAL_TABLET | ORAL | Status: DC | PRN

## 2017-08-02 MED ORDER — EPHEDRINE 5 MG/ML INJ
10.0000 mg | INTRAVENOUS | Status: DC | PRN
  Filled 2017-08-02: qty 2

## 2017-08-02 MED ORDER — LACTATED RINGERS IV SOLN
500.0000 mL | Freq: Once | INTRAVENOUS | Status: AC
  Administered 2017-08-02: 500 mL via INTRAVENOUS

## 2017-08-02 MED ORDER — COCONUT OIL OIL: 1.0000 "application " | TOPICAL_OIL | Status: DC | PRN

## 2017-08-02 MED ORDER — LIDOCAINE HCL (PF) 1 % IJ SOLN
INTRAMUSCULAR | Status: DC | PRN
  Administered 2017-08-02: 13 mL via EPIDURAL

## 2017-08-02 MED ORDER — SENNOSIDES-DOCUSATE SODIUM 8.6-50 MG PO TABS
2.0000 | ORAL_TABLET | ORAL | Status: DC
  Filled 2017-08-02: qty 2

## 2017-08-02 MED ORDER — IBUPROFEN 600 MG PO TABS
600.0000 mg | ORAL_TABLET | Freq: Four times a day (QID) | ORAL | Status: DC
  Administered 2017-08-02 – 2017-08-03 (×3): 600 mg via ORAL
  Filled 2017-08-02 (×2): qty 1

## 2017-08-02 MED ORDER — LACTATED RINGERS IV SOLN
INTRAVENOUS | Status: DC
  Administered 2017-08-02 (×3): via INTRAVENOUS

## 2017-08-02 MED ORDER — SOD CITRATE-CITRIC ACID 500-334 MG/5ML PO SOLN: 30.0000 mL | ORAL | Status: DC | PRN

## 2017-08-02 NOTE — Progress Notes (Signed)
OB PN:  S: Pt resting comfortably, no acute complaints  O: BP 135/86   Pulse 71   Ht 5\' 5"  (1.651 m)   Wt 94.1 kg (207 lb 8 oz)   LMP 11/01/2016   BMI 34.53 kg/m   FHT: 120bpm, moderate variablity, + accels, no decels Toco: q3-395min SVE: 1-2/50/-3, Cook balloon placed  Results for orders placed or performed during the hospital encounter of 08/02/17 (from the past 24 hour(s))  CBC     Status: Abnormal   Collection Time: 08/02/17  1:30 AM  Result Value Ref Range   WBC 12.1 (H) 4.0 - 10.5 K/uL   RBC 4.07 3.87 - 5.11 MIL/uL   Hemoglobin 12.8 12.0 - 15.0 g/dL   HCT 16.136.9 09.636.0 - 04.546.0 %   MCV 90.7 78.0 - 100.0 fL   MCH 31.4 26.0 - 34.0 pg   MCHC 34.7 30.0 - 36.0 g/dL   RDW 40.913.5 81.111.5 - 91.415.5 %   Platelets 209 150 - 400 K/uL  [facility]      Status: None   Collection Time: 08/02/17  1:30 AM  Result Value Ref Range   ABO/RH(D) A POS    Antibody Screen NEG    Sample Expiration      08/05/2017 Performed at [facility], [address] [place], [place] [id number]      A/P: 37 y.o. [id number] @ 793w1d for elective IOL 1. FWB: Cat. I 2. Labor: Adriana Simasook placed, plan to start Pit per protocol Pain: IV or epidural upon request GBS: neg

## 2017-08-02 NOTE — Anesthesia Pain Management Evaluation Note (Signed)
  CRNA Pain Management Visit Note  Patient: Tanya Gregory, 37 y.o., female  "Hello I am a member of the anesthesia team at [facility]. We have an anesthesia team available at all times to provide care throughout the hospital, including epidural management and anesthesia for C-section. I don't know your plan for the delivery whether it a natural birth, water birth, IV sedation, nitrous supplementation, doula or epidural, but we want to meet your pain goals."   1.Was your pain managed to your expectations on prior hospitalizations?   Yes   2.What is your expectation for pain management during this hospitalization?     Epidural  3.How can we help you reach that goal? Epidural when pain goal is reached.  Record the patient's initial score and the patient's pain goal.   Pain: 4  Pain Goal: 7 [facility] wants you to be able to say your pain was always managed very well.  Terrilee Dudzik 08/02/2017

## 2017-08-02 NOTE — Progress Notes (Signed)
OB PN:  S: Starting to feel more painful contractions 5/10  O: BP 134/88   Pulse 78   Temp 98.6 F (37 C) (Oral)   Resp 18   Ht 5\' 5"  (1.651 m)   Wt 94.1 kg (207 lb 8 oz)   LMP 11/01/2016   BMI 34.53 kg/m   FHT: 125bpm, moderate variablity, + accels, no decels Toco: q2-424min SVE: 4/60/-2, AROM clear/blood-tinged fluid   A/P: 37 y.o. [id number] @ 6025w1d for IOL 1. FWB: Cat. I 2. Labor: continue Pit per protocol 3. Elevated BP- plan for lab work due to h/o preeclampsia Pain: plan for epidural GBS: neg  Myna HidalgoJennifer Nomar Broad, DO [phone] (cell) [phone] (office)

## 2017-08-02 NOTE — Anesthesia Procedure Notes (Signed)
Epidural Patient location during procedure: OB Start time: 08/02/2017 12:59 PM End time: 08/02/2017 1:20 PM  Staffing Anesthesiologist: Lowella CurbMiller, Marie Borowski Ray, MD Performed: anesthesiologist   Preanesthetic Checklist Completed: patient identified, site marked, surgical consent, pre-op evaluation, timeout performed, IV checked, risks and benefits discussed and monitors and equipment checked  Epidural Patient position: sitting Prep: ChloraPrep Patient monitoring: heart rate, cardiac monitor, continuous pulse ox and blood pressure Approach: midline Location: L2-L3 Injection technique: LOR saline  Needle:  Needle type: Tuohy  Needle gauge: 17 G Needle length: 9 cm Needle insertion depth: 4 cm Catheter type: closed end flexible Catheter size: 20 Guage Catheter at skin depth: 8 cm Test dose: negative  Assessment Events: blood not aspirated, injection not painful, no injection resistance, negative IV test and no paresthesia  Additional Notes Reason for block:procedure for pain

## 2017-08-02 NOTE — Anesthesia Preprocedure Evaluation (Signed)
Anesthesia Evaluation  Patient identified by MRN, date of birth, ID band Patient awake    Reviewed: Allergy & Precautions, NPO status , Patient's Chart, lab work & pertinent test results  Airway Mallampati: II  TM Distance: >3 FB Neck ROM: Full    Dental  (+) Dental Advisory Given   Pulmonary neg pulmonary ROS,    breath sounds clear to auscultation       Cardiovascular negative cardio ROS   Rhythm:Regular Rate:Normal     Neuro/Psych negative neurological ROS     GI/Hepatic negative GI ROS, Neg liver ROS,   Endo/Other  negative endocrine ROS  Renal/GU negative Renal ROS     Musculoskeletal   Abdominal   Peds  Hematology  (+) anemia ,   Anesthesia Other Findings   Reproductive/Obstetrics (+) Pregnancy Ruptured ectopic pregnancy                             Lab Results  Component Value Date   WBC 13.9 (H) 08/02/2017   HGB 12.9 08/02/2017   HCT 36.7 08/02/2017   MCV 90.0 08/02/2017   PLT 211 08/02/2017   Lab Results  Component Value Date   CREATININE 0.98 11/17/2009   BUN 5 (L) 11/17/2009   NA 138 11/17/2009   K 4.3 11/17/2009   CL 103 11/17/2009   CO2 25 11/17/2009    Anesthesia Physical  Anesthesia Plan  ASA: II and emergent  Anesthesia Plan: Epidural   Post-op Pain Management:    Induction:   PONV Risk Score and Plan:   Airway Management Planned:   Additional Equipment:   Intra-op Plan:   Post-operative Plan:   Informed Consent: I have reviewed the patients History and Physical, chart, labs and discussed the procedure including the risks, benefits and alternatives for the proposed anesthesia with the patient or authorized representative who has indicated his/her understanding and acceptance.   Dental advisory given  Plan Discussed with: CRNA  Anesthesia Plan Comments:         Anesthesia Quick Evaluation

## 2017-08-02 NOTE — Anesthesia Postprocedure Evaluation (Signed)
Anesthesia Post Note  Patient: Tanya Gregory  Procedure(s) Performed: AN AD HOC LABOR EPIDURAL     Patient location during evaluation: Mother Baby Anesthesia Type: Epidural Level of consciousness: awake Pain management: satisfactory to patient Vital Signs Assessment: post-procedure vital signs reviewed and stable Respiratory status: spontaneous breathing Cardiovascular status: stable Anesthetic complications: no    Last Vitals:  Vitals:   08/02/17 1500 08/02/17 1604  BP: (!) 135/93 (!) 144/79  Pulse: 85 74  Resp:  18  Temp:  37.7 C  SpO2:  100%    Last Pain:  Vitals:   08/02/17 1606  TempSrc:   PainSc: 2    Pain Goal: Patients Stated Pain Goal: 5 (08/02/17 0115)               Cephus ShellingBURGER,Aniyia Rane

## 2017-08-03 ENCOUNTER — Encounter (HOSPITAL_COMMUNITY): Payer: Self-pay | Admitting: *Deleted

## 2017-08-03 LAB — CBC
HCT: 36.9 % (ref 36.0–46.0)
Hemoglobin: 12.6 g/dL (ref 12.0–15.0)
MCH: 30.8 pg (ref 26.0–34.0)
MCHC: 34.1 g/dL (ref 30.0–36.0)
MCV: 90.2 fL (ref 78.0–100.0)
Platelets: 218 10*3/uL (ref 150–400)
RBC: 4.09 MIL/uL (ref 3.87–5.11)
RDW: 13.7 % (ref 11.5–15.5)
WBC: 12.7 10*3/uL — AB (ref 4.0–10.5)

## 2017-08-03 MED ORDER — IBUPROFEN 600 MG PO TABS: 600.0000 mg | ORAL_TABLET | Freq: Four times a day (QID) | ORAL | 0 refills | Status: DC

## 2017-08-03 NOTE — Progress Notes (Signed)
Postpartum Note Day # 1  S:  Patient resting comfortable in bed.  Pain controlled.  Tolerating general. + flatus, no BM.  Lochia moderate.  Ambulating without difficulty.  She denies n/v/f/c, SOB, or CP.  Pt plans on breastfeeding.  O: Temp:  [98.5 F (36.9 C)-99.9 F (37.7 C)] 99 F (37.2 C) (03/12 0500) Pulse Rate:  [62-104] 71 (03/12 0500) Resp:  [16-20] 16 (03/12 0500) BP: (123-155)/(65-108) 128/71 (03/12 0500) SpO2:  [97 %-100 %] 97 % (03/11 2131) Gen: A&Ox3, NAD CV: RRR, no MRG Resp: CTAB Abdomen: soft, NT, ND  Uterus: firm, non-tender, below umbilicus Incision: c/d/i, bandage on Ext: 2+ bilateral edema, no calf tenderness bilaterally, SCDs in place  Labs:  Recent Labs    08/02/17 1239 08/03/17 0535  HGB 12.9 12.6    A/P: Pt is a 37 y.o. [id number] s/p NSVD, PPD#1  - Pain well controlled -GU: UOP is adequate -GI: Tolerating general diet -Activity: encouraged sitting up to chair and ambulation as tolerated -Prophylaxis: early ambulation -Labs: stable as above  Meeting postpartum milestones appropriately, plan for discharge home today  Myna HidalgoJennifer Addaline Peplinski, DO [phone] (pager) [phone] (office)

## 2017-08-03 NOTE — Discharge Instructions (Signed)
Vaginal Delivery, Care After °Refer to this sheet in the next few weeks. These instructions provide you with information about caring for yourself after vaginal delivery. Your health care provider may also give you more specific instructions. Your treatment has been planned according to current medical practices, but problems sometimes occur. Call your health care provider if you have any problems or questions. °What can I expect after the procedure? °After vaginal delivery, it is common to have: °· Some bleeding from your vagina. °· Soreness in your abdomen, your vagina, and the area of skin between your vaginal opening and your anus (perineum). °· Pelvic cramps. °· Fatigue. ° °Follow these instructions at home: °Medicines °· Take over-the-counter and prescription medicines only as told by your health care provider. °· If you were prescribed an antibiotic medicine, take it as told by your health care provider. Do not stop taking the antibiotic until it is finished. °Driving ° °· Do not drive or operate heavy machinery while taking prescription pain medicine. °· Do not drive for 24 hours if you received a sedative. °Lifestyle °· Do not drink alcohol. This is especially important if you are breastfeeding or taking medicine to relieve pain. °· Do not use tobacco products, including cigarettes, chewing tobacco, or e-cigarettes. If you need help quitting, ask your health care provider. °Eating and drinking °· Drink at least 8 eight-ounce glasses of water every day unless you are told not to by your health care provider. If you choose to breastfeed your baby, you may need to drink more water than this. °· Eat high-fiber foods every day. These foods may help prevent or relieve constipation. High-fiber foods include: °? Whole grain cereals and breads. °? Brown rice. °? Beans. °? Fresh fruits and vegetables. °Activity °· Return to your normal activities as told by your health care provider. Ask your health care provider  what activities are safe for you. °· Rest as much as possible. Try to rest or take a nap when your baby is sleeping. °· Do not lift anything that is heavier than your baby or 10 lb (4.5 kg) until your health care provider says that it is safe. °· Talk with your health care provider about when you can engage in sexual activity. This may depend on your: °? Risk of infection. °? Rate of healing. °? Comfort and desire to engage in sexual activity. °Vaginal Care °· If you have an episiotomy or a vaginal tear, check the area every day for signs of infection. Check for: °? More redness, swelling, or pain. °? More fluid or blood. °? Warmth. °? Pus or a bad smell. °· Do not use tampons or douches until your health care provider says this is safe. °· Watch for any blood clots that may pass from your vagina. These may look like clumps of dark red, brown, or black discharge. °General instructions °· Keep your perineum clean and dry as told by your health care provider. °· Wear loose, comfortable clothing. °· Wipe from front to back when you use the toilet. °· Ask your health care provider if you can shower or take a bath. If you had an episiotomy or a perineal tear during labor and delivery, your health care provider may tell you not to take baths for a certain length of time. °· Wear a bra that supports your breasts and fits you well. °· If possible, have someone help you with household activities and help care for your baby for at least a few days after   you leave the hospital. °· Keep all follow-up visits for you and your baby as told by your health care provider. This is important. °Contact a health care provider if: °· You have: °? Vaginal discharge that has a bad smell. °? Difficulty urinating. °? Pain when urinating. °? A sudden increase or decrease in the frequency of your bowel movements. °? More redness, swelling, or pain around your episiotomy or vaginal tear. °? More fluid or blood coming from your episiotomy or  vaginal tear. °? Pus or a bad smell coming from your episiotomy or vaginal tear. °? A fever. °? A rash. °? Little or no interest in activities you used to enjoy. °? Questions about caring for yourself or your baby. °· Your episiotomy or vaginal tear feels warm to the touch. °· Your episiotomy or vaginal tear is separating or does not appear to be healing. °· Your breasts are painful, hard, or turn red. °· You feel unusually sad or worried. °· You feel nauseous or you vomit. °· You pass large blood clots from your vagina. If you pass a blood clot from your vagina, save it to show to your health care provider. Do not flush blood clots down the toilet without having your health care provider look at them. °· You urinate more than usual. °· You are dizzy or light-headed. °· You have not breastfed at all and you have not had a menstrual period for 12 weeks after delivery. °· You have stopped breastfeeding and you have not had a menstrual period for 12 weeks after you stopped breastfeeding. °Get help right away if: °· You have: °? Pain that does not go away or does not get better with medicine. °? Chest pain. °? Difficulty breathing. °? Blurred vision or spots in your vision. °? Thoughts about hurting yourself or your baby. °· You develop pain in your abdomen or in one of your legs. °· You develop a severe headache. °· You faint. °· You bleed from your vagina so much that you fill two sanitary pads in one hour. °This information is not intended to replace advice given to you by your health care provider. Make sure you discuss any questions you have with your health care provider. °Document Released: 05/08/2000 Document Revised: 10/23/2015 Document Reviewed: 05/26/2015 °Elsevier Interactive Patient Education © 2018 Elsevier Inc. ° °

## 2017-08-03 NOTE — Lactation Note (Signed)
This note was copied from a baby's chart. Lactation Consultation Note  Patient Name: Tanya Gregory [id number]'s Date: 08/03/2017 Reason for consult: Initial assessment;Infant weight loss;Other (Comment)(2%, post circ, )  Baby is 2221 hours old  LC reviewed and updated the doc flow sheets per mom  Latch scores - 9's  Mom denies soreness, sore nipple and engorgement prevention and tx reviewed.  LC instructed mom on the use of hand pump and increased flange size to #27 if needed  When milk comes in.  Per mom feels comfortable with latching. Mother informed of post-discharge support and given phone number to the lactation department, including services for phone call assistance; out-patient appointments; and breastfeeding support group. List of other breastfeeding resources in the community given in the handout. Encouraged mother to call for problems or concerns related to breastfeeding.   Maternal Data Has patient been taught Hand Expression?: Yes  Feeding Feeding Type: (pe rmom baby recently fed at 1025 for 15 mins ) Length of feed: 15 min  LATCH Score                   Interventions Interventions: Breast feeding basics reviewed;Hand pump  Lactation Tools Discussed/Used Tools: Pump Breast pump type: Manual WIC Program: No Pump Review: Setup, frequency, and cleaning;Milk Storage Initiated by:: MAI  Date initiated:: 08/03/17   Consult Status Consult Status: Complete Date: 08/03/17    Tanya Gregory 08/03/2017, 11:17 AM

## 2017-08-04 NOTE — Discharge Summary (Signed)
OB Discharge Summary     Patient Name: Tanya Gregory DOB: October 30, 1980 MRN: [id number]  Date of admission: 08/02/2017 Delivering MD: Myna HidalgoZAN, Lamon Rotundo   Date of discharge: 08/03/2017  Admitting diagnosis: INDUCTION Intrauterine pregnancy: 6385w1d     Secondary diagnosis:  Active Problems:   Normal intrauterine pregnancy in third trimester  Additional problems: none     Discharge diagnosis: Term Pregnancy Delivered                                                                                                Post partum procedures:n/a  Augmentation: AROM, Pitocin, Cytotec and Foley Balloon  Complications: None  Hospital course:  Induction of Labor With Vaginal Delivery   37 y.o. yo [id number] at 3085w1d was admitted to the hospital 08/02/2017 for induction of labor.  Indication for induction: Favorable cervix at term.  Patient had an uncomplicated labor course as follows: Membrane Rupture Time/Date: 12:25 PM ,08/02/2017   Intrapartum Procedures: Episiotomy: None [1]                                         Lacerations:  2nd degree [3]  Patient had delivery of a Viable infant.  Information for the patient's newborn:  Althea GrimmerCrawford, Joshua Grayson [[id number]][[id number]]  Delivery Method: Vaginal, Spontaneous(Filed from Delivery Summary)   08/02/2017  Details of delivery can be found in separate delivery note.  Patient had a routine postpartum course. Patient is discharged home 08/04/17.  Physical exam  Vitals:   08/02/17 1604 08/02/17 1700 08/02/17 2131 08/03/17 0500  BP: (!) 144/79 140/82 130/65 128/71  Pulse: 74 67 73 71  Resp: 18 18 18 16   Temp: 99.9 F (37.7 C) 99 F (37.2 C) 99.3 F (37.4 C) 99 F (37.2 C)  TempSrc: Oral Oral Oral Oral  SpO2: 100% 100% 97%   Weight:      Height:       General: alert, cooperative and no distress Lochia: appropriate Uterine Fundus: firm Incision: N/A DVT Evaluation: No evidence of DVT seen on physical exam. Labs: Lab Results  Component Value Date   WBC 12.7 (H) 08/03/2017   HGB 12.6 08/03/2017   HCT 36.9 08/03/2017   MCV 90.2 08/03/2017   PLT 218 08/03/2017   CMP Latest Ref Rng & Units 08/02/2017  Glucose 65 - 99 mg/dL 76  BUN 6 - 20 mg/dL 9  Creatinine 3.240.44 - 4.011.00 mg/dL 0.270.65  Sodium 253135 - 664145 mmol/L 132(L)  Potassium 3.5 - 5.1 mmol/L 3.8  Chloride 101 - 111 mmol/L 102  CO2 22 - 32 mmol/L 19(L)  Calcium 8.9 - 10.3 mg/dL 8.9  Total Protein 6.5 - 8.1 g/dL 6.3(L)  Total Bilirubin 0.3 - 1.2 mg/dL 0.8  Alkaline Phos 38 - 126 U/L 265(H)  AST 15 - 41 U/L 21  ALT 14 - 54 U/L 13(L)    Discharge instruction: per After Visit Summary and "Baby and Me Booklet".  After visit meds:  Allergies as of 08/03/2017   No Known Allergies  Medication List    TAKE these medications   calcium carbonate 500 MG chewable tablet Commonly known as:  TUMS - dosed in mg elemental calcium Chew 1-2 tablets by mouth daily.   ibuprofen 600 MG tablet Commonly known as:  ADVIL,MOTRIN Take 1 tablet (600 mg total) by mouth every 6 (six) hours.   prenatal multivitamin Tabs tablet Take 1 tablet by mouth daily at 12 noon.   ranitidine 150 MG tablet Commonly known as:  ZANTAC Take 150 mg by mouth 2 (two) times daily.       Diet: routine diet  Activity: Advance as tolerated. Pelvic rest for 6 weeks.   Outpatient follow up:2 weeks Follow up Appt:No future appointments. Follow up Visit:No Follow-up on file.  Postpartum contraception: Progesterone only pills  Newborn Data: Live born female  Birth Weight: 7 lb 9 oz (3430 g) APGAR: 9, 9  Newborn Delivery   Birth date/time:  08/02/2017 13:53:00 Delivery type:  Vaginal, Spontaneous     Baby Feeding: Breast Disposition:home with mother   08/04/2017 Sharon Seller, DO

## 2017-08-08 ENCOUNTER — Inpatient Hospital Stay (HOSPITAL_COMMUNITY)
Admission: AD | Admit: 2017-08-08 | Payer: Managed Care, Other (non HMO) | Source: Ambulatory Visit | Admitting: Obstetrics & Gynecology

## 2018-04-15 IMAGING — US US OB COMP LESS 14 WK
1 series · 15 of 28 positions shown · non-contrast
Comparison: None.

CLINICAL DATA: Lower abdominal pain since [DATE].

EXAM:
OBSTETRIC <14 WK US AND TRANSVAGINAL OB US
TECHNIQUE: Both transabdominal and transvaginal ultrasound examinations were
performed for complete evaluation of the gestation as well as the
maternal uterus, adnexal regions, and pelvic cul-de-sac.
Transvaginal technique was performed to assess early pregnancy.

[Series 1: us ob comp less 14 wk · 15 of 40 slices shown]
[im 1/40]
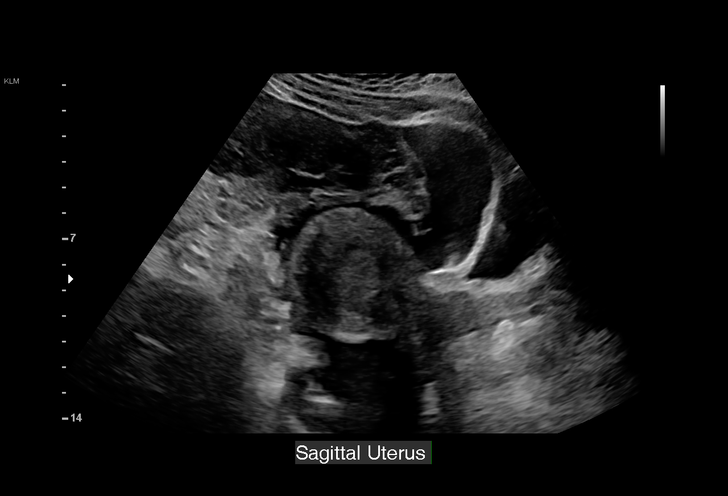
[im 3/40]
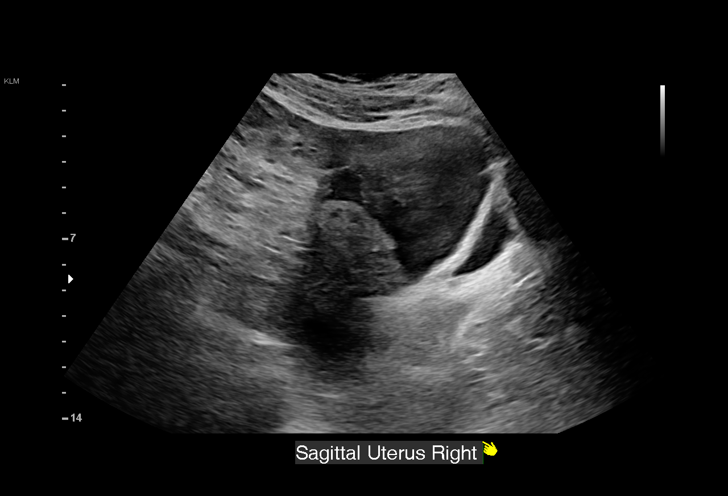
[im 6/40]
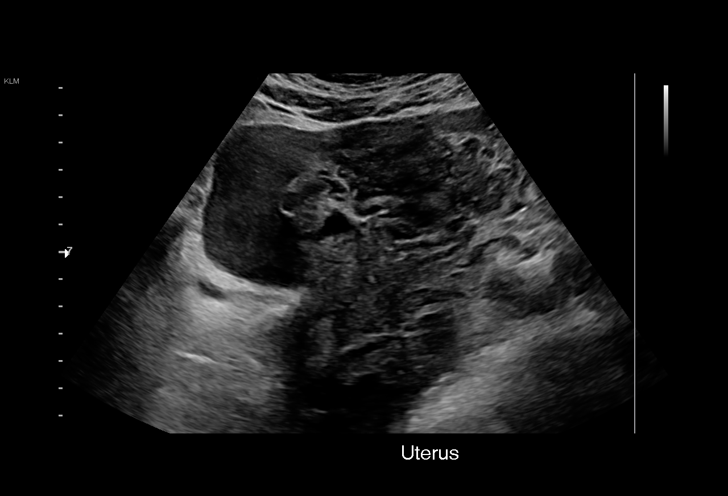
[im 9/40]
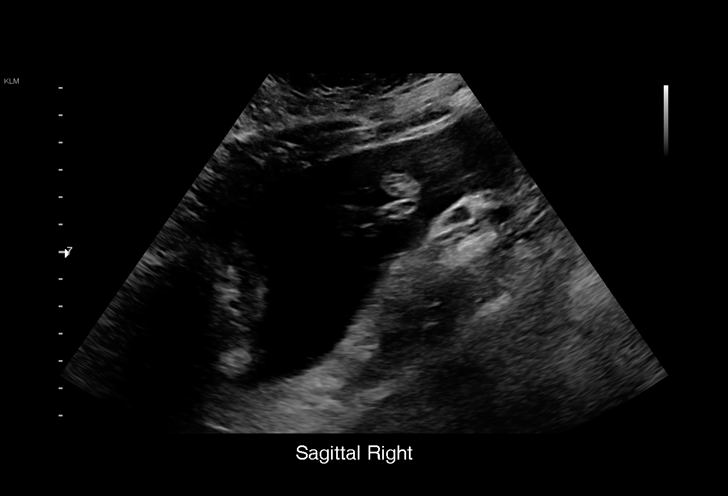
[im 12/40]
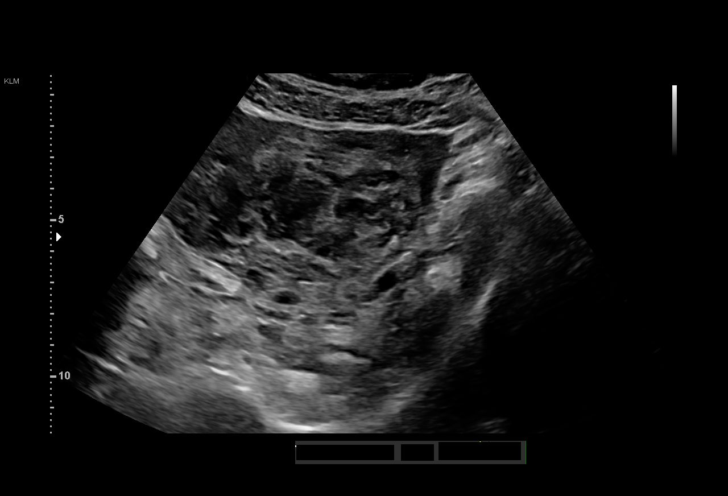
[im 15/40]
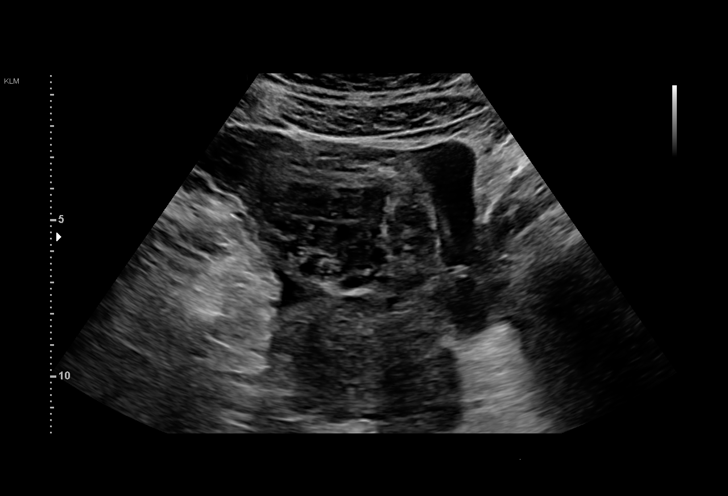
[im 18/40]
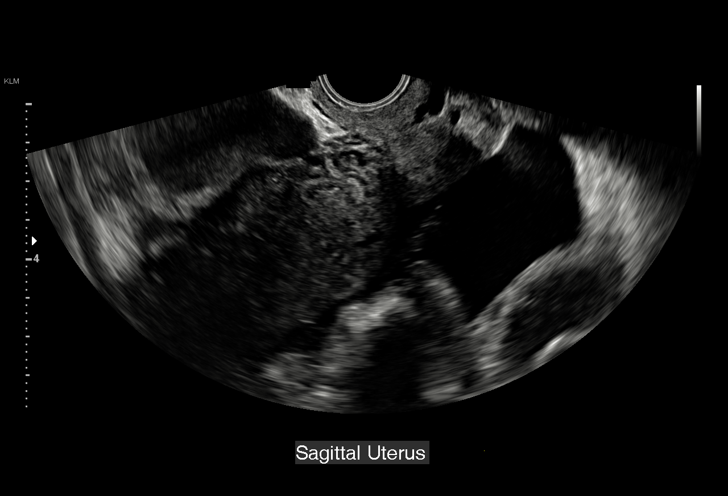
[im 21/40]
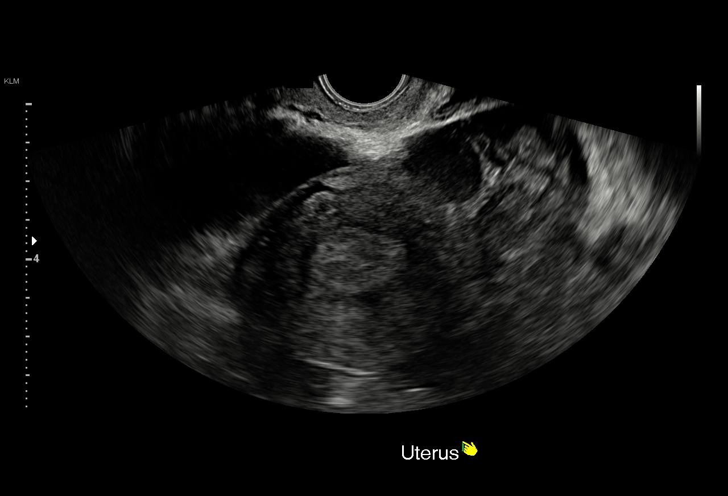
[im 22/40]
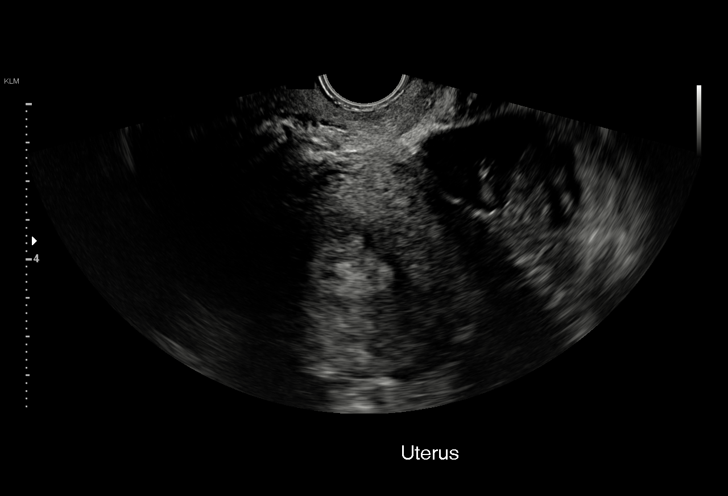
[im 25/40]
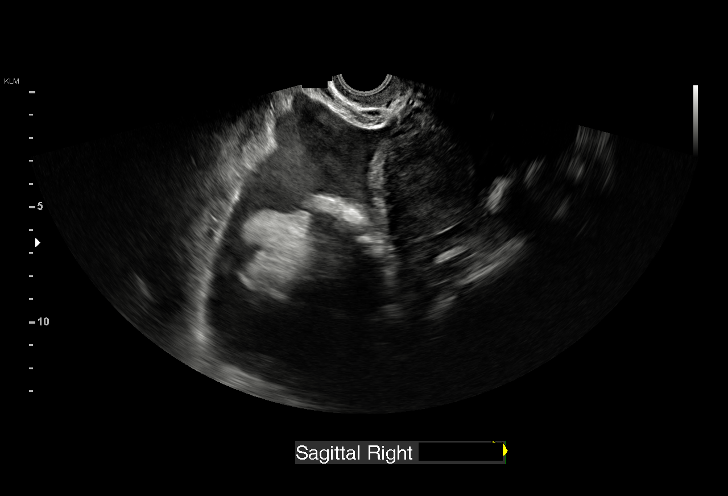
[im 28/40]
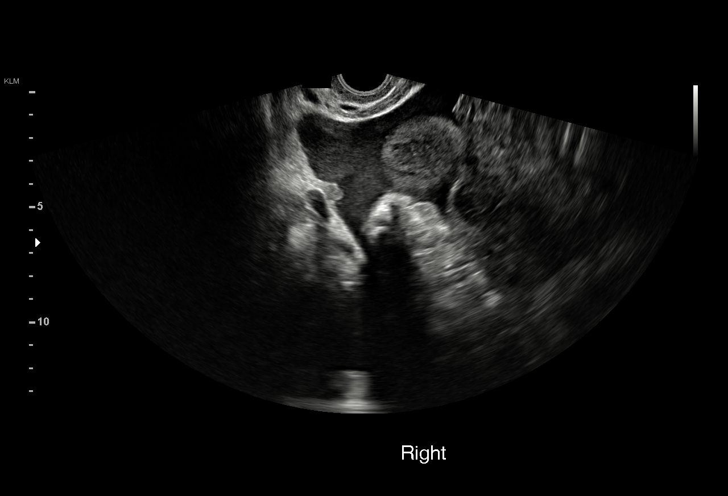
[im 31/40]
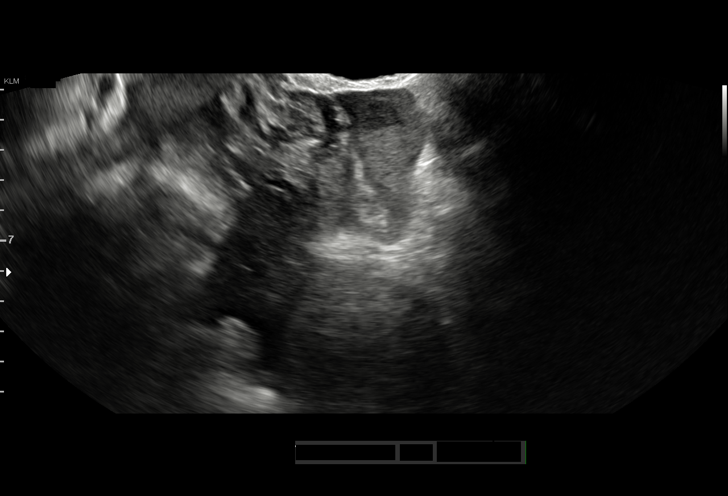
[im 34/40]
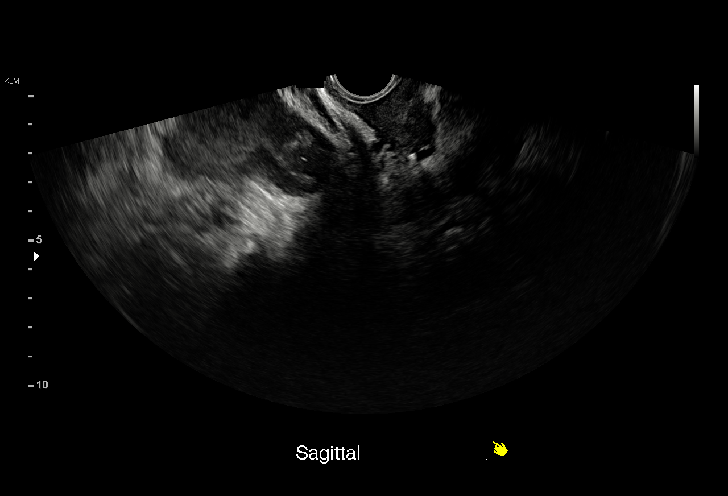
[im 37/40]
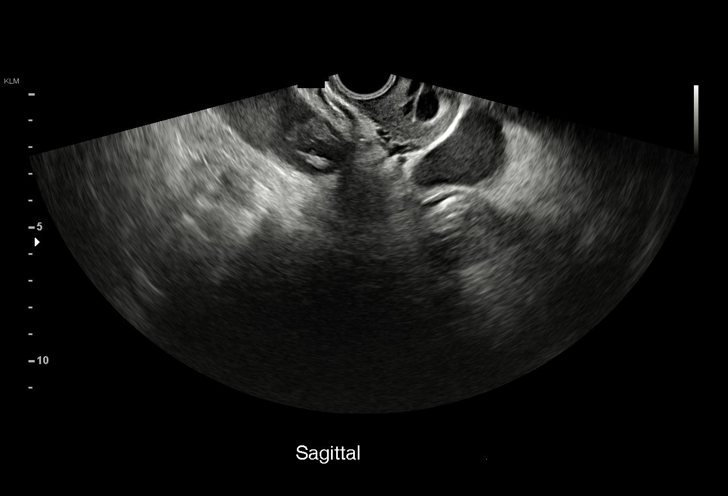
[im 40/40]
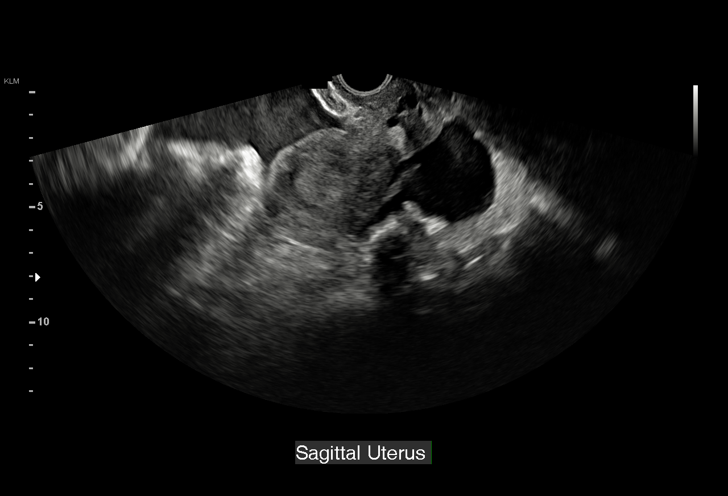

[15 of 28 positions shown; findings below may reference images not displayed]

FINDINGS: Intrauterine gestational sac: Not visible

Yolk sac:  Not visible

Embryo:  Not visible

Cardiac Activity: Not visible

Heart Rate:   bpm

MSD:   mm    w     d

CRL:    mm    w    d                  US EDC:

Subchorionic hemorrhage:  None visualized.

Maternal uterus/adnexae: Large volume complex fluid and clot
throughout the pelvis. Somewhat more mass-like appearance in the
left adnexal region.
IMPRESSION: Large volume blood and clot in the pelvis. No gestational sac in the
uterus. Highly suspicious for ruptured ectopic gestation. These
results were called by telephone at the time of interpretation on

## 2018-05-24 ENCOUNTER — Ambulatory Visit (INDEPENDENT_AMBULATORY_CARE_PROVIDER_SITE_OTHER): Payer: Medicaid Other | Admitting: Emergency Medicine

## 2018-05-24 ENCOUNTER — Encounter: Payer: Self-pay | Admitting: Emergency Medicine

## 2018-05-24 ENCOUNTER — Other Ambulatory Visit: Payer: Self-pay

## 2018-05-24 VITALS — BP 111/74 | HR 92 | Temp 98.5°F | Resp 16 | Ht 65.0 in | Wt 158.6 lb

## 2018-05-24 DIAGNOSIS — Z113 Encounter for screening for infections with a predominantly sexual mode of transmission: Secondary | ICD-10-CM

## 2018-05-24 DIAGNOSIS — N898 Other specified noninflammatory disorders of vagina: Secondary | ICD-10-CM | POA: Diagnosis not present

## 2018-05-24 DIAGNOSIS — Z202 Contact with and (suspected) exposure to infections with a predominantly sexual mode of transmission: Secondary | ICD-10-CM

## 2018-05-24 DIAGNOSIS — B9689 Other specified bacterial agents as the cause of diseases classified elsewhere: Secondary | ICD-10-CM

## 2018-05-24 DIAGNOSIS — N760 Acute vaginitis: Secondary | ICD-10-CM

## 2018-05-24 LAB — POCT WET + KOH PREP
Trich by wet prep: ABSENT
YEAST BY WET PREP: ABSENT
Yeast by KOH: ABSENT

## 2018-05-24 LAB — POCT URINALYSIS DIP (MANUAL ENTRY)
BILIRUBIN UA: NEGATIVE
Glucose, UA: NEGATIVE mg/dL
Ketones, POC UA: NEGATIVE mg/dL
Leukocytes, UA: NEGATIVE
NITRITE UA: NEGATIVE
Protein Ur, POC: NEGATIVE mg/dL
RBC UA: NEGATIVE
Urobilinogen, UA: 0.2 E.U./dL
pH, UA: 5.5 (ref 5.0–8.0)

## 2018-05-24 MED ORDER — METRONIDAZOLE 500 MG PO TABS: 500.0000 mg | ORAL_TABLET | Freq: Two times a day (BID) | ORAL | 0 refills | Status: AC

## 2018-05-24 MED ORDER — AZITHROMYCIN 1 G PO PACK: 1.0000 g | PACK | Freq: Once | ORAL | 0 refills | Status: AC

## 2018-05-24 NOTE — Progress Notes (Signed)
Tanya Gregory 37 y.o.   Chief Complaint  Patient presents with  . Vaginitis    itching per patient a few weeks and STD testing    HISTORY OF PRESENT ILLNESS: This is a 37 y.o. female complaining of vaginal itching for 3 weeks.  Also complaining of slight vaginal discharge.  Requesting to be checked for STDs.  Husband recently tested positive for chlamydia so she has been exposed.  Denies fever or chills.  Denies nausea or vomiting.  Denies pelvic or abdominal pain. Breast-feeding 34-month-old son. HPI   Prior to Admission medications   Medication Sig Start Date End Date Taking? Authorizing Provider  calcium carbonate (TUMS - DOSED IN MG ELEMENTAL CALCIUM) 500 MG chewable tablet Chew 1-2 tablets by mouth daily.    [provider]  ibuprofen (ADVIL,MOTRIN) 600 MG tablet Take 1 tablet (600 mg total) by mouth every 6 (six) hours. Patient not taking: Reported on 05/24/2018 08/03/17   Myna Hidalgo, DO  Prenatal Vit-Fe Fumarate-FA (PRENATAL MULTIVITAMIN) TABS tablet Take 1 tablet by mouth daily at 12 noon.    [provider]  ranitidine (ZANTAC) 150 MG tablet Take 150 mg by mouth 2 (two) times daily.    [provider]    No Known Allergies  Patient Active Problem List   Diagnosis Date Noted  . Normal intrauterine pregnancy in third trimester 08/02/2017    Past Medical History:  Diagnosis Date  . History of pre-eclampsia   . HSV-2 (herpes simplex virus 2) infection   . Nephrolithiasis 2015  . Vaginal Pap smear, abnormal     Past Surgical History:  Procedure Laterality Date  . DIAGNOSTIC LAPAROSCOPY WITH REMOVAL OF ECTOPIC PREGNANCY  08/30/2016   Procedure: DIAGNOSTIC LAPAROSCOPYLEFT SALPINGECTOMY WITH REMOVAL OF ECTOPIC PREGNANCY;  Surgeon: Myna Hidalgo, DO;  Location: [facility];  Service: Gynecology;;  . LEEP  age 46    Social History   Socioeconomic History  . Marital status: Single    Spouse name: Not on file  . Number of children: 1  . Years  of education: Not on file  . Highest education level: Not on file  Occupational History  . Occupation: Runner, broadcasting/film/video  Social Needs  . Financial resource strain: Not on file  . Food insecurity:    Worry: Not on file    Inability: Not on file  . Transportation needs:    Medical: Not on file    Non-medical: Not on file  Tobacco Use  . Smoking status: Never Smoker  . Smokeless tobacco: Never Used  Substance and Sexual Activity  . Alcohol use: Yes    Comment: every now and then  . Drug use: No  . Sexual activity: Not on file  Lifestyle  . Physical activity:    Days per week: Not on file    Minutes per session: Not on file  . Stress: Not on file  Relationships  . Social connections:    Talks on phone: Not on file    Gets together: Not on file    Attends religious service: Not on file    Active member of club or organization: Not on file    Attends meetings of clubs or organizations: Not on file    Relationship status: Not on file  . Intimate partner violence:    Fear of current or ex partner: Not on file    Emotionally abused: Not on file    Physically abused: Not on file    Forced sexual activity: Not on  file  Other Topics Concern  . Not on file  Social History Narrative   Lives her daughter Jerrel Ivory(Gabrielle).   Smoke detectors in home? Yes    Guns in home? No    Consistent seatbelt use? Yes    Consistent dental brushing and flossing? Yes    Semi-Annual dental visits: Yes    Annual Eye exams: No     Family History  Problem Relation Age of Onset  . Hypertension Mother   . COPD Maternal Grandmother        colon  . Stroke Paternal Grandmother   . Heart attack Paternal Grandmother      Review of Systems  Constitutional: Negative for chills and fever.  HENT: Negative.   Eyes: Negative.  Negative for blurred vision and double vision.  Respiratory: Negative.  Negative for cough and shortness of breath.   Cardiovascular: Negative.  Negative for chest pain and palpitations.    Gastrointestinal: Negative.  Negative for abdominal pain, nausea and vomiting.  Genitourinary: Negative.  Negative for dysuria and hematuria.  Skin: Negative.  Negative for rash.  Neurological: Negative.  Negative for dizziness and headaches.  Endo/Heme/Allergies: Negative.   All other systems reviewed and are negative.     Vitals:   05/24/18 1438  BP: 111/74  Pulse: 92  Resp: 16  Temp: 98.5 F (36.9 C)  SpO2: 97%    Physical Exam Exam conducted with a chaperone present.  Constitutional:      Appearance: Normal appearance.  HENT:     Head: Normocephalic and atraumatic.  Eyes:     Extraocular Movements: Extraocular movements intact.     Pupils: Pupils are equal, round, and reactive to light.  Neck:     Musculoskeletal: Normal range of motion.  Cardiovascular:     Rate and Rhythm: Normal rate and regular rhythm.     Heart sounds: Normal heart sounds.  Pulmonary:     Effort: Pulmonary effort is normal.     Breath sounds: Normal breath sounds.  Abdominal:     General: There is no distension.     Palpations: Abdomen is soft.     Tenderness: There is no abdominal tenderness.     Hernia: There is no hernia in the right inguinal area or left inguinal area.  Genitourinary:    Pubic Area: No rash.      Labia:        Right: No rash, tenderness or lesion.        Left: No rash, tenderness or lesion.      Vagina: Normal. No vaginal discharge or erythema.     Cervix: No cervical motion tenderness, discharge, friability or lesion.  Musculoskeletal: Normal range of motion.  Lymphadenopathy:     Lower Body: No right inguinal adenopathy. No left inguinal adenopathy.  Skin:    General: Skin is warm and dry.  Neurological:     General: No focal deficit present.     Mental Status: She is alert and oriented to person, place, and time.  Psychiatric:        Mood and Affect: Mood normal.        Behavior: Behavior normal.    Results for orders placed or performed in visit on  05/24/18 (from the past 24 hour(s))  POCT urinalysis dipstick     Status: Abnormal   Collection Time: 05/24/18  3:16 PM  Result Value Ref Range   Color, UA yellow yellow   Clarity, UA clear clear   Glucose, UA negative  negative mg/dL   Bilirubin, UA negative negative   Ketones, POC UA negative negative mg/dL   Spec Grav, UA >=8.295 (A) 1.010 - 1.025   Blood, UA negative negative   pH, UA 5.5 5.0 - 8.0   Protein Ur, POC negative negative mg/dL   Urobilinogen, UA 0.2 0.2 or 1.0 E.U./dL   Nitrite, UA Negative Negative   Leukocytes, UA Negative Negative  POCT Wet + KOH Prep     Status: Abnormal   Collection Time: 05/24/18  3:35 PM  Result Value Ref Range   Yeast by KOH Absent Absent   Yeast by wet prep Absent Absent   WBC by wet prep Few Few   Clue Cells Wet Prep HPF POC Many (A) None   Trich by wet prep Absent Absent   Bacteria Wet Prep HPF POC Many (A) Few   Epithelial Cells By [employer] ([facility]) Few None, Few, Too numerous to count   RBC,UR,HPF,POC None None RBC/hpf    Up to date consulted for the treatment of bacterial vaginosis for breast-feeding woman.  Metronidazole recommended.  ASSESSMENT & PLAN: Tanya Gregory was seen today for vaginitis.  Diagnoses and all orders for this visit:  Vaginal discharge -     POCT urinalysis dipstick -     Cancel: POCT Wet Prep ([facility] [place]) -     POCT Wet + KOH Prep  Routine screening for STI (sexually transmitted infection) -     Cancel: WET PREP FOR TRICH, YEAST, CLUE -     RPR -     HCV Ab w Reflex to Quant PCR -     HIV Antibody (routine testing w rflx) -     GC/Chlamydia Probe Amp([facility]) -     POCT Wet + KOH Prep  Exposure to chlamydia -     GC/Chlamydia Probe Amp([facility]) -     POCT Wet + KOH Prep -     azithromycin (ZITHROMAX) 1 g powder; Take 1 packet (1 g total) by mouth once for 1 dose.  Bacterial vaginosis -     metroNIDAZOLE (FLAGYL) 500 MG tablet; Take 1 tablet (500 mg total) by mouth 2 (two) times daily for 7  days.     Patient Instructions       If you have lab work done today you will be contacted with your lab results within the next 2 weeks.  If you have not heard from [place] then please contact us. The fastest way to get your results is to register for My Chart.   IF you received an x-ray today, you will receive an invoice from [facility]. Please contact [facility] at [phone] with questions or concerns regarding your invoice.   IF you received labwork today, you will receive an invoice from [facility]. Please contact [facility] at [phone] with questions or concerns regarding your invoice.   Our billing staff will not be able to assist you with questions regarding bills from these companies.  You will be contacted with the lab results as soon as they are available. The fastest way to get your results is to activate your My Chart account. Instructions are located on the last page of this paperwork. If you have not heard from [place] regarding the results in 2 weeks, please contact this office.    Care Bacterial Vaginosis  Bacterial vaginosis is an infection of the vagina. It happens when too many normal germs (healthy bacteria) grow in the vagina. This infection puts you at risk for infections from  sex (STIs). Treating this infection can lower your risk for some STIs. You should also treat this if you are pregnant. It can cause your baby to be born early. Follow these instructions at home: Medicines  Take over-the-counter and prescription medicines only as told by your doctor.  Take or use your antibiotic medicine as told by your doctor. Do not stop taking or using it even if you start to feel better. General instructions  If you your sexual partner is a woman, tell her that you have this infection. She needs to get treatment if she has symptoms. If you have a female partner, he does not need to be treated.  During treatment: ? Avoid sex. ? Do not douche. ? Avoid  alcohol as told. ? Avoid breastfeeding as told.  Drink enough fluid to keep your pee (urine) clear or pale yellow.  Keep your vagina and butt (rectum) clean. ? Wash the area with warm water every day. ? Wipe from front to back after you use the toilet.  Keep all follow-up visits as told by your doctor. This is important. Preventing this condition  Do not douche.  Use only warm water to wash around your vagina.  Use protection when you have sex. This includes: ? Latex condoms. ? Dental dams.  Limit how many people you have sex with. It is best to only have sex with the same person (be monogamous).  Get tested for STIs. Have your partner get tested.  Wear underwear that is cotton or lined with cotton.  Avoid tight pants and pantyhose. This is most important in summer.  Do not use any products that have nicotine or tobacco in them. These include cigarettes and e-cigarettes. If you need help quitting, ask your doctor.  Do not use illegal drugs.  Limit how much alcohol you drink. Contact a doctor if:  Your symptoms do not get better, even after you are treated.  You have more discharge or pain when you pee (urinate).  You have a fever.  You have pain in your belly (abdomen).  You have pain with sex.  Your bleed from your vagina between periods. Summary  This infection happens when too many germs (bacteria) grow in the vagina.  Treating this condition can lower your risk for some infections from sex (STIs).  You should also treat this if you are pregnant. It can cause early (premature) birth.  Do not stop taking or using your antibiotic medicine even if you start to feel better. This information is not intended to replace advice given to you by your health care provider. Make sure you discuss any questions you have with your health care provider. Document Released: 02/18/2008 Document Revised: 01/25/2016 Document Reviewed: 01/25/2016 Elsevier Interactive Patient  Education  2019 Else[employer] Inc.      Edwina BarthMiguel Corey Caulfield, MD [facility]

## 2018-05-24 NOTE — Patient Instructions (Addendum)
If you have lab work done today you will be contacted with your lab results within the next 2 weeks.  If you have not heard from us then please contact us. The fastest way to get your results is to register for My Chart.   IF you received an x-ray today, you will receive an invoice from [facility]. Please contact [facility] at [phone] with questions or concerns regarding your invoice.   IF you received labwork today, you will receive an invoice from [facility]. Please contact [facility] at [phone] with questions or concerns regarding your invoice.   Our billing staff will not be able to assist you with questions regarding bills from these companies.  You will be contacted with the lab results as soon as they are available. The fastest way to get your results is to activate your My Chart account. Instructions are located on the last page of this paperwork. If you have not heard from us regarding the results in 2 weeks, please contact this office.    Care Bacterial Vaginosis  Bacterial vaginosis is an infection of the vagina. It happens when too many normal germs (healthy bacteria) grow in the vagina. This infection puts you at risk for infections from sex (STIs). Treating this infection can lower your risk for some STIs. You should also treat this if you are pregnant. It can cause your baby to be born early. Follow these instructions at home: Medicines  Take over-the-counter and prescription medicines only as told by your doctor.  Take or use your antibiotic medicine as told by your doctor. Do not stop taking or using it even if you start to feel better. General instructions  If you your sexual partner is a woman, tell her that you have this infection. She needs to get treatment if she has symptoms. If you have a female partner, he does not need to be treated.  During treatment: ? Avoid sex. ? Do not douche. ? Avoid alcohol as told. ? Avoid breastfeeding  as told.  Drink enough fluid to keep your pee (urine) clear or pale yellow.  Keep your vagina and butt (rectum) clean. ? Wash the area with warm water every day. ? Wipe from front to back after you use the toilet.  Keep all follow-up visits as told by your doctor. This is important. Preventing this condition  Do not douche.  Use only warm water to wash around your vagina.  Use protection when you have sex. This includes: ? Latex condoms. ? Dental dams.  Limit how many people you have sex with. It is best to only have sex with the same person (be monogamous).  Get tested for STIs. Have your partner get tested.  Wear underwear that is cotton or lined with cotton.  Avoid tight pants and pantyhose. This is most important in summer.  Do not use any products that have nicotine or tobacco in them. These include cigarettes and e-cigarettes. If you need help quitting, ask your doctor.  Do not use illegal drugs.  Limit how much alcohol you drink. Contact a doctor if:  Your symptoms do not get better, even after you are treated.  You have more discharge or pain when you pee (urinate).  You have a fever.  You have pain in your belly (abdomen).  You have pain with sex.  Your bleed from your vagina between periods. Summary  This infection happens when too many germs (bacteria) grow in the vagina.  Treating this  condition can lower your risk for some infections from sex (STIs).  You should also treat this if you are pregnant. It can cause early (premature) birth.  Do not stop taking or using your antibiotic medicine even if you start to feel better. This information is not intended to replace advice given to you by your health care provider. Make sure you discuss any questions you have with your health care provider. Document Released: 02/18/2008 Document Revised: 01/25/2016 Document Reviewed: 01/25/2016 Elsevier Interactive Patient Education  2019 [employer].

## 2018-05-25 LAB — HIV ANTIBODY (ROUTINE TESTING W REFLEX): HIV SCREEN 4TH GENERATION: NONREACTIVE

## 2018-05-25 LAB — RPR: RPR: NONREACTIVE

## 2018-05-25 LAB — HCV AB W REFLEX TO QUANT PCR

## 2018-05-25 LAB — HCV INTERPRETATION

## 2018-05-26 ENCOUNTER — Telehealth: Payer: Self-pay | Admitting: Emergency Medicine

## 2018-05-26 ENCOUNTER — Encounter: Payer: Self-pay | Admitting: *Deleted

## 2018-05-26 LAB — GC/CHLAMYDIA PROBE AMP
Chlamydia trachomatis, NAA: NEGATIVE
Neisseria gonorrhoeae by PCR: NEGATIVE

## 2018-05-26 NOTE — Progress Notes (Signed)
Patient will pick up letter. 

## 2018-05-26 NOTE — Telephone Encounter (Signed)
Copied from CRM [id number]. Topic: General - Other >> May 26, 2018  3:22 PM Jilda Roche wrote: Reason for CRM: Pt called for lab results and Lenon Curt states to put a CRM in to call patient back  Best call back is [phone]

## 2018-05-27 ENCOUNTER — Telehealth: Payer: Self-pay | Admitting: Emergency Medicine

## 2018-05-27 NOTE — Telephone Encounter (Signed)
Patient returned called and was informed that labs results were negative and a lab latter has been mailed

## 2018-05-27 NOTE — Telephone Encounter (Signed)
Letter sent to pt per Cynthia's note.

## 2018-07-31 LAB — OB RESULTS CONSOLE ABO/RH: RH Type: POSITIVE

## 2018-07-31 LAB — OB RESULTS CONSOLE RUBELLA ANTIBODY, IGM: Rubella: IMMUNE

## 2018-07-31 LAB — OB RESULTS CONSOLE HIV ANTIBODY (ROUTINE TESTING): HIV: NONREACTIVE

## 2018-07-31 LAB — OB RESULTS CONSOLE RPR: RPR: NONREACTIVE

## 2018-07-31 LAB — OB RESULTS CONSOLE HEPATITIS B SURFACE ANTIGEN: Hepatitis B Surface Ag: NEGATIVE

## 2018-07-31 LAB — OB RESULTS CONSOLE ANTIBODY SCREEN: Antibody Screen: NEGATIVE

## 2019-01-16 ENCOUNTER — Inpatient Hospital Stay (HOSPITAL_COMMUNITY)
Admission: AD | Admit: 2019-01-16 | Discharge: 2019-01-17 | DRG: 833 | Disposition: A | Discharge status: Home / Self Care | Payer: Medicaid Other | Attending: Obstetrics & Gynecology | Admitting: Obstetrics & Gynecology

## 2019-01-16 ENCOUNTER — Inpatient Hospital Stay (HOSPITAL_COMMUNITY): Payer: Medicaid Other

## 2019-01-16 ENCOUNTER — Encounter (HOSPITAL_COMMUNITY): Payer: Self-pay | Admitting: Student

## 2019-01-16 ENCOUNTER — Other Ambulatory Visit: Payer: Self-pay

## 2019-01-16 DIAGNOSIS — Z20828 Contact with and (suspected) exposure to other viral communicable diseases: Secondary | ICD-10-CM | POA: Diagnosis present

## 2019-01-16 DIAGNOSIS — O09523 Supervision of elderly multigravida, third trimester: Secondary | ICD-10-CM | POA: Diagnosis not present

## 2019-01-16 DIAGNOSIS — O4693 Antepartum hemorrhage, unspecified, third trimester: Secondary | ICD-10-CM | POA: Diagnosis not present

## 2019-01-16 DIAGNOSIS — Z3A31 31 weeks gestation of pregnancy: Secondary | ICD-10-CM | POA: Diagnosis not present

## 2019-01-16 DIAGNOSIS — O4403 Placenta previa specified as without hemorrhage, third trimester: Secondary | ICD-10-CM

## 2019-01-16 DIAGNOSIS — O4400 Placenta previa specified as without hemorrhage, unspecified trimester: Secondary | ICD-10-CM | POA: Diagnosis present

## 2019-01-16 DIAGNOSIS — O4413 Placenta previa with hemorrhage, third trimester: Principal | ICD-10-CM | POA: Diagnosis present

## 2019-01-16 LAB — CBC
HCT: 35.9 % — ABNORMAL LOW (ref 36.0–46.0)
Hemoglobin: 12.8 g/dL (ref 12.0–15.0)
MCH: 33.3 pg (ref 26.0–34.0)
MCHC: 35.7 g/dL (ref 30.0–36.0)
MCV: 93.5 fL (ref 80.0–100.0)
Platelets: 249 10*3/uL (ref 150–400)
RBC: 3.84 MIL/uL — ABNORMAL LOW (ref 3.87–5.11)
RDW: 13.1 % (ref 11.5–15.5)
WBC: 11.2 10*3/uL — ABNORMAL HIGH (ref 4.0–10.5)
nRBC: 0 % (ref 0.0–0.2)

## 2019-01-16 LAB — TYPE AND SCREEN
ABO/RH(D): A POS
Antibody Screen: NEGATIVE

## 2019-01-16 LAB — SARS CORONAVIRUS 2 (TAT 6-24 HRS): SARS Coronavirus 2: NEGATIVE

## 2019-01-16 LAB — ABO/RH: ABO/RH(D): A POS

## 2019-01-16 MED ORDER — CALCIUM CARBONATE ANTACID 500 MG PO CHEW
2.0000 | CHEWABLE_TABLET | ORAL | Status: DC | PRN
  Administered 2019-01-16 – 2019-01-17 (×2): 400 mg via ORAL
  Filled 2019-01-16 (×2): qty 2

## 2019-01-16 MED ORDER — DOCUSATE SODIUM 100 MG PO CAPS: 100.0000 mg | ORAL_CAPSULE | Freq: Every day | ORAL | Status: DC

## 2019-01-16 MED ORDER — ZOLPIDEM TARTRATE 5 MG PO TABS: 5.0000 mg | ORAL_TABLET | Freq: Every evening | ORAL | Status: DC | PRN

## 2019-01-16 MED ORDER — PRENATAL MULTIVITAMIN CH
1.0000 | ORAL_TABLET | Freq: Every day | ORAL | Status: DC
  Administered 2019-01-16: 1 via ORAL
  Filled 2019-01-16: qty 1

## 2019-01-16 MED ORDER — LACTATED RINGERS IV SOLN
INTRAVENOUS | Status: DC
  Administered 2019-01-16: 12:00:00 via INTRAVENOUS

## 2019-01-16 MED ORDER — ACETAMINOPHEN 325 MG PO TABS: 650.0000 mg | ORAL_TABLET | ORAL | Status: DC | PRN

## 2019-01-16 MED ORDER — BETAMETHASONE SOD PHOS & ACET 6 (3-3) MG/ML IJ SUSP
12.0000 mg | INTRAMUSCULAR | Status: AC
  Administered 2019-01-16 – 2019-01-17 (×2): 12 mg via INTRAMUSCULAR
  Filled 2019-01-16 (×2): qty 2

## 2019-01-16 NOTE — MAU Provider Note (Signed)
Chief Complaint:  Vaginal Bleeding   First Provider Initiated Contact with Patient 01/16/19 0859     HPI: Tanya Gregory is a 38 y.o. [id number] at [redacted]w[redacted]d who presents to maternity admissions reporting vaginal bleeding. Has known complete placenta previa. Last scan was 7/31. This is her first bleeding episode during this pregnancy. Was in the shower when she started bleeding this morning. Has not passed clots. Feels like she is still bleeding but is not saturating pads. Has maintained pelvic rest. No abdominal pain. Good fetal movement.    Past Medical History:  Diagnosis Date  . History of pre-eclampsia   . HSV-2 (herpes simplex virus 2) infection   . Nephrolithiasis 2015  . Vaginal Pap smear, abnormal    OB History  Gravida Para Term Preterm AB Living  6 2 2   3 2   SAB TAB Ectopic Multiple Live Births  1 1 1   2     # Outcome Date GA Lbr Len/2nd Weight Sex Delivery Anes PTL Lv  6 Current           5 Term 08/02/17 [redacted]w[redacted]d    Vag-Spont   LIV  4 Ectopic 2018          3 TAB 2013          2 Term 10/2009 [redacted]w[redacted]d   F Vag-Spont   LIV     Birth Comments: pre eclampsia  1 SAB 2008            Obstetric Comments  1st SAB at 4 months   Past Surgical History:  Procedure Laterality Date  . DIAGNOSTIC LAPAROSCOPY WITH REMOVAL OF ECTOPIC PREGNANCY  08/30/2016   Procedure: DIAGNOSTIC LAPAROSCOPYLEFT SALPINGECTOMY WITH REMOVAL OF ECTOPIC PREGNANCY;  Surgeon: Janyth Pupa, DO;  Location: [facility];  Service: Gynecology;;  . LEEP  age 72   Family History  Problem Relation Age of Onset  . Hypertension Mother   . COPD Maternal Grandmother        colon  . Stroke Paternal Grandmother   . Heart attack Paternal Grandmother    Social History   Tobacco Use  . Smoking status: Never Smoker  . Smokeless tobacco: Never Used  Substance Use Topics  . Alcohol use: Yes    Comment: every now and then  . Drug use: No   No Known Allergies Medications Prior to Admission  Medication Sig Dispense Refill Last  Dose  . calcium carbonate (TUMS - DOSED IN MG ELEMENTAL CALCIUM) 500 MG chewable tablet Chew 1-2 tablets by mouth daily.   Past Week at Unknown time  . Prenatal Vit-Fe Fumarate-FA (PRENATAL MULTIVITAMIN) TABS tablet Take 1 tablet by mouth daily at 12 noon.   01/16/2019 at Unknown time  . ibuprofen (ADVIL,MOTRIN) 600 MG tablet Take 1 tablet (600 mg total) by mouth every 6 (six) hours. (Patient not taking: Reported on 05/24/2018) 30 tablet 0   . ranitidine (ZANTAC) 150 MG tablet Take 150 mg by mouth 2 (two) times daily.       I have reviewed patient's Past Medical Hx, Surgical Hx, Family Hx, Social Hx, medications and allergies.   ROS:  Review of Systems  Constitutional: Negative.   Gastrointestinal: Negative.   Genitourinary: Positive for vaginal bleeding.    Physical Exam   Patient Vitals for the past 24 hrs:  BP Temp Pulse Resp SpO2  01/16/19 0840 127/82 - - - -  01/16/19 0833 128/89 98.6 F (37 C) 92 18 100 %    Constitutional: Well-developed, well-nourished  female in no acute distress.  Cardiovascular: normal rate & rhythm, no murmur Respiratory: normal effort, lung sounds clear throughout GI: Abd soft, non-tender, gravid appropriate for gestational age. Pos BS x 4 MS: Extremities nontender, no edema, normal ROM Neurologic: Alert and oriented x 4.  GU:      Pelvic: NEFG, few small (~1cm) dark red clots in vagina. Slow ooze of dark red blood from os. Cervix visually closed. Digital exam deferred.     NST:  Baseline: 135 bpm, Variability: Good {> 6 bpm), Accelerations: Reactive and Decelerations: Absent   Labs: Results for orders placed or performed during the hospital encounter of 01/16/19 (from the past 24 hour(s))  CBC     Status: Abnormal   Collection Time: 01/16/19  9:10 AM  Result Value Ref Range   WBC 11.2 (H) 4.0 - 10.5 K/uL   RBC 3.84 (L) 3.87 - 5.11 MIL/uL   Hemoglobin 12.8 12.0 - 15.0 g/dL   HCT 16.135.9 (L) 09.636.0 - 04.546.0 %   MCV 93.5 80.0 - 100.0 fL   MCH 33.3 26.0  - 34.0 pg   MCHC 35.7 30.0 - 36.0 g/dL   RDW 40.913.1 81.111.5 - 91.415.5 %   Platelets 249 150 - 400 K/uL   nRBC 0.0 0.0 - 0.2 %  Type and screen     Status: None   Collection Time: 01/16/19  9:19 AM  Result Value Ref Range   ABO/RH(D) A POS    Antibody Screen NEG    Sample Expiration      01/19/2019,2359 Performed at [facility], [address] [place], [place] [address]     Imaging:  No results found.  MAU Course: Orders Placed This Encounter  Procedures  . SARS CORONAVIRUS 2 Nasal Swab Aptima Multi Swab  . US MFM OB DETAIL +14 WK  . CBC  . Diet regular Room service appropriate? Yes; Fluid consistency: Thin  . Notify physician (specify)  . Vital signs  . Defer vaginal exam for vaginal bleeding or PROM <37 weeks  . Initiate Oral Care Protocol  . Initiate Carrier Fluid Protocol  . SCDs  . Bed rest with bathroom privileges  . Fetal monitoring  . Continuous tocometry  . Full code  . Type and screen  . ABO/Rh  . Saline lock IV  . Admit to Inpatient (patient's expected length of stay will be greater than 2 midnights or inpatient only procedure)   Meds ordered this encounter  Medications  . acetaminophen (TYLENOL) tablet 650 mg  . zolpidem (AMBIEN) tablet 5 mg  . docusate sodium (COLACE) capsule 100 mg  . calcium carbonate (TUMS - dosed in mg elemental calcium) chewable tablet 400 mg of elemental calcium  . prenatal multivitamin tablet 1 tablet  . lactated ringers infusion  . betamethasone acetate-betamethasone sodium phosphate (CELESTONE) injection 12 mg    MDM: Reactive NST. No ctx & denies abdominal pain.  On exam, dark red blood coming from os. Cervix visually closed.  CBC & T/S ordered.   Discussed patient with Dr. Richardson Doppole. [facility] & get ultrasound while inpatient. Dr. Richardson Doppole confirmed with neo that admission is ok at this time. Patient aware that if she delivers while NICU is high census, baby would potentially be transferred to another  facility.   Assessment: 1. Placenta previa in third trimester   2. [redacted] weeks gestation of pregnancy   3. Vaginal bleeding in pregnancy, third trimester     Plan: Admit to  [facility] unit IV in place BMZ#1 given in MAU Covid testing in process Continuous fetal monitoring while patient is bleeding  Judeth HornLawrence, Radek Carnero, NP 01/16/2019 11:02 AM

## 2019-01-16 NOTE — MAU Note (Signed)
.   Tanya Gregory is a 38 y.o. at [redacted]w[redacted]d here in MAU reporting: vaginal bleeding that started around 7 this morning.Noticed it in the shower. Pt states that she has only had one pad with small amount of vaginal bleeding, Known complete previa. Denies any pain. +FM  Onset of complaint: this morning Pain score: 0 Vitals:   01/16/19 0833  BP: 128/89  Pulse: 92  Resp: 18  Temp: 98.6 F (37 C)  SpO2: 100%     FHT:140 Lab orders placed from triage:

## 2019-01-16 NOTE — H&P (Signed)
HPI: Tanya Gregory is a 38 y.o. [id number] at 8059w2d who presents to maternity admissions reporting vaginal bleeding. Has known complete placenta previa. Last scan was 7/31. This is her first bleeding episode during this pregnancy. Was in the shower when she started bleeding this morning. Has not passed clots. Feels like she is still bleeding but is not saturating pads. Has maintained pelvic rest. No abdominal pain. Good fetal movement.        Past Medical History:  Diagnosis Date  . History of pre-eclampsia   . HSV-2 (herpes simplex virus 2) infection   . Nephrolithiasis 2015  . Vaginal Pap smear, abnormal                    OB History  Gravida Para Term Preterm AB Living  6 2 2   3 2   SAB TAB Ectopic Multiple Live Births     1 1 1   2        # Outcome Date GA Lbr Len/2nd Weight Sex Delivery Anes PTL Lv  6 Current           5 Term 08/02/17 2515w1d    Vag-Spont   LIV  4 Ectopic 2018          3 TAB 2013          2 Term 10/2009 4849w0d   F Vag-Spont   LIV     Birth Comments: pre eclampsia  1 SAB 2008            Obstetric Comments  1st SAB at 4 months        Past Surgical History:  Procedure Laterality Date  . DIAGNOSTIC LAPAROSCOPY WITH REMOVAL OF ECTOPIC PREGNANCY  08/30/2016   Procedure: DIAGNOSTIC LAPAROSCOPYLEFT SALPINGECTOMY WITH REMOVAL OF ECTOPIC PREGNANCY;  Surgeon: Myna HidalgoJennifer Ozan, DO;  Location: [facility];  Service: Gynecology;;  . LEEP  age 38        Family History  Problem Relation Age of Onset  . Hypertension Mother   . COPD Maternal Grandmother        colon  . Stroke Paternal Grandmother   . Heart attack Paternal Grandmother    Social History        Tobacco Use  . Smoking status: Never Smoker  . Smokeless tobacco: Never Used  Substance Use Topics  . Alcohol use: Yes    Comment: every now and then  . Drug use: No   No Known Allergies        Medications Prior to Admission  Medication Sig Dispense  Refill Last Dose  . calcium carbonate (TUMS - DOSED IN MG ELEMENTAL CALCIUM) 500 MG chewable tablet Chew 1-2 tablets by mouth daily.   Past Week at Unknown time  . Prenatal Vit-Fe Fumarate-FA (PRENATAL MULTIVITAMIN) TABS tablet Take 1 tablet by mouth daily at 12 noon.   01/16/2019 at Unknown time  . ibuprofen (ADVIL,MOTRIN) 600 MG tablet Take 1 tablet (600 mg total) by mouth every 6 (six) hours. (Patient not taking: Reported on 05/24/2018) 30 tablet 0   . ranitidine (ZANTAC) 150 MG tablet Take 150 mg by mouth 2 (two) times daily.       I have reviewed patient's Past Medical Hx, Surgical Hx, Family Hx, Social Hx, medications and allergies.   ROS:  Review of Systems  Constitutional: Negative.   Gastrointestinal: Negative.   Genitourinary: Positive for vaginal bleeding.    Physical Exam   Patient Vitals for the past 24 hrs:  BP Temp  Pulse Resp SpO2  01/16/19 0840 127/82 - - - -  01/16/19 0833 128/89 98.6 F (37 C) 92 18 100 %    Constitutional: Well-developed, well-nourished female in no acute distress.  Cardiovascular: normal rate & rhythm, no murmur Respiratory: normal effort, lung sounds clear throughout GI: Abd soft, non-tender, gravid appropriate for gestational age. Pos BS x 4 MS: Extremities nontender, no edema, normal ROM Neurologic: Alert and oriented x 4.  GU:      Pelvic: NEFG, few small (~1cm) dark red clots in vagina. Slow ooze of dark red blood from os. Cervix visually closed. Digital exam deferred.                NST:  Baseline: 135 bpm, Variability: Good {> 6 bpm), Accelerations: Reactive and Decelerations: Absent   Labs: Lab Results Last 24 Hours        Results for orders placed or performed during the hospital encounter of 01/16/19 (from the past 24 hour(s))  CBC     Status: Abnormal   Collection Time: 01/16/19  9:10 AM  Result Value Ref Range   WBC 11.2 (H) 4.0 - 10.5 K/uL   RBC 3.84 (L) 3.87 - 5.11 MIL/uL   Hemoglobin 12.8 12.0 - 15.0  g/dL   HCT 16.135.9 (L) 09.636.0 - 04.546.0 %   MCV 93.5 80.0 - 100.0 fL   MCH 33.3 26.0 - 34.0 pg   MCHC 35.7 30.0 - 36.0 g/dL   RDW 40.913.1 81.111.5 - 91.415.5 %   Platelets 249 150 - 400 K/uL   nRBC 0.0 0.0 - 0.2 %  Type and screen     Status: None   Collection Time: 01/16/19  9:19 AM  Result Value Ref Range   ABO/RH(D) A POS    Antibody Screen NEG    Sample Expiration      01/19/2019,2359 Performed at [facility], [address] [place], [place] [address]       Imaging:  No results found.  MAU Course:    Orders Placed This Encounter  Procedures  . SARS CORONAVIRUS 2 Nasal Swab Aptima Multi Swab  . US MFM OB DETAIL +14 WK  . CBC  . Diet regular Room service appropriate? Yes; Fluid consistency: Thin  . Notify physician (specify)  . Vital signs  . Defer vaginal exam for vaginal bleeding or PROM <37 weeks  . Initiate Oral Care Protocol  . Initiate Carrier Fluid Protocol  . SCDs  . Bed rest with bathroom privileges  . Fetal monitoring  . Continuous tocometry  . Full code  . Type and screen  . ABO/Rh  . Saline lock IV  . Admit to Inpatient (patient's expected length of stay will be greater than 2 midnights or inpatient only procedure)      Meds ordered this encounter  Medications  . acetaminophen (TYLENOL) tablet 650 mg  . zolpidem (AMBIEN) tablet 5 mg  . docusate sodium (COLACE) capsule 100 mg  . calcium carbonate (TUMS - dosed in mg elemental calcium) chewable tablet 400 mg of elemental calcium  . prenatal multivitamin tablet 1 tablet  . lactated ringers infusion  . betamethasone acetate-betamethasone sodium phosphate (CELESTONE) injection 12 mg       Discussed patient with Dr. Richardson Doppole. [facility] & get ultrasound while inpatient. Dr. Richardson Doppole confirmed with neo that admission is ok at this time. Patient aware that if she delivers while NICU is high census, baby would potentially be transferred to another  facility.   Assessment: 1.  Placenta previa in third trimester   2. [redacted] weeks gestation of pregnancy   3. Vaginal bleeding in pregnancy, third trimester     Plan: Admit to [facility] unit IV in place BMZ#1 given in MAU repeat in 24 hours  Covid testing in process  Patient seen and examined. Abdomen is nontender  Old blood on current pad no active bleeding.  Fetal Well Being - Category 1 tracing- change to NST q shift Pad counts Recommend bedrest with bathroom priveleges- consider D/C home once pt is 24 hours without bleeding.  SCD's while in bed  DR. Ozan to assume care 01/17/2019 at 7 am

## 2019-01-17 NOTE — Progress Notes (Signed)
Antepartum HD#1  S: PT resting comfortably in bed.  Notes only slight brown discharge no her pad, no further bleeding.  No contractions or pelvic pain. No LOF, +FM.  No acute complaints this am.  Tolerating general diet.  Voiding without difficulty.  O: BP 105/69 (BP Location: Left Arm)   Pulse 74   Temp 98.2 F (36.8 C) (Oral)   Resp 17   Ht 5\' 5"  (1.651 m)   Wt 84.8 kg   LMP 06/11/2018   SpO2 99%   BMI 31.11 kg/m   Gen: NAD CV: RRR  Lungs: CTAB Abd: gravid, non-tender GU: deferred Ext: no edema, no calf tenderness bilaterally  FHT: 120, moderate variability, +accels, no decels Toco: no regular contractions  A/P: 37TI [id number] @ [redacted]w[redacted]d admitted for bleeding with known placenta previa -FWB- Cat. I -no further vaginal bleeding noted since arrival -BMZ #2 to be given today ~10am -SCD's while in bed  -Following BMZ, if she still had not had any further bleeding, plan to discharge home with modified bedrest and close outpatient follow up  Janyth Pupa, DO [phone] (cell) [phone] (office)

## 2019-01-17 NOTE — Discharge Instructions (Signed)
Placenta Previa °Placenta previa is a condition in which the placenta implants in the lower part of the uterus in pregnant women. The placenta either partially or completely covers the opening to the cervix. This is a problem because the baby must pass through the cervix during delivery. There are three types of placenta previa: °· Marginal placenta previa. The placenta reaches within an inch (2.5 cm) of the cervical opening but does not cover it. °· Partial placenta previa. The placenta covers part of the cervical opening. °· Complete placenta previa. The placenta covers the entire cervical opening. °If the previa is marginal or partial and it is diagnosed in the first half of pregnancy, the placenta may move into a normal position as the pregnancy progresses and may no longer cover the cervix. It is important to keep all prenatal visits with your health care provider so you can be more closely monitored. °What are the causes? °The cause of this condition is not known. °What increases the risk? °This condition is more likely to develop in women who: °· Are carrying more than one baby (multiples). °· Have an abnormally shaped uterus. °· Have scars on the lining of the uterus. °· Have had surgeries involving the uterus, such as a cesarean delivery. °· Have delivered a baby before. °· Have a history of placenta previa. °· Have smoked or used cocaine during pregnancy. °· Are age 35 or older during pregnancy. °What are the signs or symptoms? °The main symptom of this condition is sudden, painless vaginal bleeding during the second half of pregnancy. The amount of bleeding can be very light at first, and it usually stops on its own. Heavier bleeding episodes may also happen. Some women with placenta previa may have no bleeding at all. °How is this diagnosed? °· This condition is diagnosed: °? From an ultrasound. This test uses sound waves to find where the placenta is located before you have any bleeding  episodes. °? During a checkup after vaginal bleeding is noticed. °· If you are diagnosed with a partial or complete previa, digital exams with fingers will generally be avoided. Your health care provider will still perform a speculum exam. °· If you did not have an ultrasound during your pregnancy, placenta previa may not be diagnosed until bleeding occurs during labor. °How is this treated? °Treatment for this condition may include: °· Decreased activity. °· Bed rest at home or in the hospital. °· Pelvic rest. Nothing is placed inside the vagina during pelvic rest. This means not having sex and not using tampons or douches. °· A blood transfusion to replace blood that you have lost (maternal blood loss). °· A cesarean delivery. This may be performed if: °? The bleeding is heavy and cannot be controlled. °? The placenta completely covers the cervix. °· Medicines to stop premature labor or to help the baby's lungs to mature. This treatment may be used if you need delivery before your pregnancy is full-term. °Your treatment will be decided based on: °· How much you are bleeding, or whether the bleeding has stopped. °· How far along you are in your pregnancy. °· The condition of your baby. °· The type of placenta previa that you have. ° °Follow these instructions at home: °· Get plenty of rest and lessen activity as told by your health care provider. °· Stay on bed rest for as long as told by your health care provider. °· Do not have sex, use tampons, use a douche, or place anything inside of   your vagina if your health care provider recommended pelvic rest. °· Take over-the-counter and prescription medicines as told by your health care provider. °· Keep all follow-up visits as told by your health care provider. This is important. °Get help right away if: °· You have vaginal bleeding, even if in small amounts and even if you have no pain. °· You have cramping or regular contractions. °· You have pain in your abdomen or  your lower back. °· You have a feeling of increased pressure in your pelvis. °· You have increased watery or bloody mucus from the vagina. °This information is not intended to replace advice given to you by your health care provider. Make sure you discuss any questions you have with your health care provider. °Document Released: 05/11/2005 Document Revised: 04/23/2017 Document Reviewed: 11/23/2015 °Elsevier Patient Education © 2020 [employer] ° °

## 2019-01-18 NOTE — Discharge Summary (Signed)
Physician Discharge Summary  Patient ID: ABBYE LAO MRN: [id number] DOB/AGE: August 31, 1980 38 y.o.  Admit date: 01/16/2019 Discharge date: 01/18/2019  Admission Diagnoses: Vaginal bleeding during pregnancy Placenta previa  Discharge Diagnoses:  Active Problems:   Placenta previa   Discharged Condition: stable  Hospital Course: 43 y.E.H2C9470 at [redacted]w[redacted]d admitted due to vaginal bleeding with known placenta previa.  Bleeding was closely observed and subsided with pelvic rest.  She received BMZ for fetal lung maturity.  After 24hr of no vaginal bleeding, she was discharged home in stable condition and plans for close outpatient follow up.  Consults: None  Significant Diagnostic Studies: labs:  CBC Latest Ref Rng & Units 01/16/2019 08/03/2017 08/02/2017  WBC 4.0 - 10.5 K/uL 11.2(H) 12.7(H) 13.9(H)  Hemoglobin 12.0 - 15.0 g/dL 12.8 12.6 12.9  Hematocrit 36.0 - 46.0 % 35.9(L) 36.9 36.7  Platelets 150 - 400 K/uL 249 218 211    Treatments: IV hydration and steroids: betamethasone  Discharge Exam: Blood pressure 112/64, pulse 89, temperature 98.5 F (36.9 C), temperature source Oral, resp. rate 18, height 5\' 5"  (1.651 m), weight 84.8 kg, last menstrual period 06/11/2018, SpO2 99 %, currently breastfeeding.  Gen: NAD CV: RRR          Lungs: CTAB Abd: gravid, non-tender GU: deferred Ext: no edema, no calf tenderness bilaterally  FHT: 120, moderate variability, +accels, no decels Toco: no regular contractions Disposition:    Allergies as of 01/17/2019   No Known Allergies     Medication List    STOP taking these medications   ibuprofen 600 MG tablet Commonly known as: ADVIL     TAKE these medications   calcium carbonate 500 MG chewable tablet Commonly known as: TUMS - dosed in mg elemental calcium Chew 1-2 tablets by mouth daily.   prenatal multivitamin Tabs tablet Take 1 tablet by mouth daily at 12 noon.   ranitidine 150 MG tablet Commonly known as: ZANTAC Take  150 mg by mouth 2 (two) times daily.      Follow-up Information    Janyth Pupa, DO Follow up in 1 week(s).   Specialty: Obstetrics and Gynecology Contact information: [address] [place] [address] [phone]           Signed: Annalee Genta 01/18/2019, 7:01 AM

## 2019-02-13 ENCOUNTER — Encounter (HOSPITAL_COMMUNITY): Payer: Self-pay

## 2019-02-13 NOTE — Patient Instructions (Signed)
Tanya Gregory  02/13/2019   Your procedure is scheduled on:  02/27/2019  Arrive at 17 at Entrance C on [address] at [facility]  and [employer] [employer] [address]. You are invited to use the FREE valet parking or use the Visitor's parking deck.  Pick up the phone at the desk and dial [phone].  Call this number if you have problems the morning of surgery: [phone]  Remember:   Do not eat food:(After Midnight) Desps de medianoche.  Do not drink clear liquids: (After Midnight) Desps de medianoche.  Take these medicines the morning of surgery with A SIP OF WATER:  none   Do not wear jewelry, make-up or nail polish.  Do not wear lotions, powders, or perfumes. Do not wear deodorant.  Do not shave 48 hours prior to surgery.  Do not bring valuables to the hospital.  [facility] is not   responsible for any belongings or valuables brought to the hospital.  Contacts, dentures or bridgework may not be worn into surgery.  Leave suitcase in the car. After surgery it may be brought to your room.  For patients admitted to the hospital, checkout time is 11:00 AM the day of              discharge.      Please read over the following fact sheets that you were given:     Preparing for Surgery

## 2019-02-23 NOTE — H&P (Signed)
HPI: 38 y/o [id number] @ [redacted]w[redacted]d estimated gestational age (as dated by LMP c/w 20 week ultrasound) presents for scheduled C-section due to placenta previa.   no Leaking of Fluid,   no Vaginal Bleeding,   some Uterine pressure, denies regular contractions,  + Fetal Movement.  Prenatal care has been provided by Dr. Nelda Marseille  ROS: no HA, no epigastric pain, no visual changes.    Pregnancy complicated by: 1) Complete placenta previa- last [place] @ [redacted]w[redacted]d (9/22)- vertex, posterior complete previa, EFW 6#4oz 2) h/o gestational HTN- s/p ASA daily, pt has been asymptomatic 3) AMA- normal tetra and [place] 4) h/o LEEP- normal cervical length  Prenatal Transfer Tool  Maternal Diabetes: No Genetic Screening: Normal Maternal Ultrasounds/Referrals: Normal Fetal Ultrasounds or other Referrals:  None Maternal Substance Abuse:  No Significant Maternal Medications:  None Significant Maternal Lab Results: None   PNL:  Rub Immune, Hep B neg, RPR NR, HIV neg, GC/C neg, glucola:114 Hgb: 13.4 Blood type: A positive  Immunizations: Declined immunizations  OBHx:  2011- FTNSVD, 8#2XH, female, complicated by gestational HTN 2019- FTNSVD, 3#7JI, female, uncomplicated Ectopic x 1 SAB x 2 PMHx:  none Meds:  PNV Allergy:  No Known Allergies SurgHx: 2018- removal of ectopic, partial salpingectomy SocHx:   no Tobacco, no  EtOH, no Illicit Drugs  O: LMP 96/78/9381   Examination in office Gen. AAOx3, NAD CV.  RRR  No murmur.  Resp. CTAB, no wheeze or crackles. Abd. Gravid,  no tenderness,  no rigidity,  no guarding Extr.  1+ edema B/L , no calf tenderness, neg Homan's B/L FHT: 140 by doppler  Labs: see orders  A/P:  38 y.o. [id number] @ [redacted]w[redacted]d EGA who presents for primary C-section due to previa -FWB:  Reassuring by doppler -LR @ 125cc/hr -CBC, Type & cross with 2upRBC on hold if needed -SCDs to OR -Ancef 2g IV to OR -Risk benefits and alternatives of cesarean section were discussed with the patient including but  not limited to infection, bleeding, damage to bowel , bladder and baby with the need for further surgery. Pt voiced understanding and desires to proceed. She is aware of the risk of bleeding and potential for further surgical intervention including hysterectomy  Janyth Pupa, DO [phone] (cell) [phone] (office)

## 2019-02-25 ENCOUNTER — Other Ambulatory Visit (HOSPITAL_COMMUNITY)
Admission: RE | Admit: 2019-02-25 | Discharge: 2019-02-25 | Disposition: A | Discharge status: Home / Self Care | Payer: Medicaid Other | Source: Ambulatory Visit | Attending: Obstetrics and Gynecology | Admitting: Obstetrics and Gynecology

## 2019-02-25 ENCOUNTER — Other Ambulatory Visit: Payer: Self-pay

## 2019-02-25 DIAGNOSIS — Z20828 Contact with and (suspected) exposure to other viral communicable diseases: Secondary | ICD-10-CM | POA: Insufficient documentation

## 2019-02-25 DIAGNOSIS — Z01812 Encounter for preprocedural laboratory examination: Secondary | ICD-10-CM | POA: Insufficient documentation

## 2019-02-25 HISTORY — DX: Complete placenta previa nos or without hemorrhage, unspecified trimester: O44.00

## 2019-02-25 LAB — CBC
HCT: 35.5 % — ABNORMAL LOW (ref 36.0–46.0)
Hemoglobin: 13.1 g/dL (ref 12.0–15.0)
MCH: 33.7 pg (ref 26.0–34.0)
MCHC: 36.9 g/dL — ABNORMAL HIGH (ref 30.0–36.0)
MCV: 91.3 fL (ref 80.0–100.0)
Platelets: 222 10*3/uL (ref 150–400)
RBC: 3.89 MIL/uL (ref 3.87–5.11)
RDW: 12.7 % (ref 11.5–15.5)
WBC: 9.9 10*3/uL (ref 4.0–10.5)
nRBC: 0 % (ref 0.0–0.2)

## 2019-02-25 LAB — SARS CORONAVIRUS 2 (TAT 6-24 HRS): SARS Coronavirus 2: NEGATIVE

## 2019-02-25 LAB — RPR: RPR Ser Ql: NONREACTIVE

## 2019-02-25 NOTE — MAU Note (Signed)
Pt here for PAT covid swab and lab draw. Pt denies symptoms and swab collected. Informed pt to not remove blood bank bracelet, pt verbalizes understanding.

## 2019-02-27 ENCOUNTER — Inpatient Hospital Stay (HOSPITAL_COMMUNITY): Payer: Medicaid Other | Admitting: Certified Registered Nurse Anesthetist

## 2019-02-27 ENCOUNTER — Encounter (HOSPITAL_COMMUNITY)
Admission: RE | Disposition: A | Discharge status: Home / Self Care | Payer: Self-pay | Source: Home / Self Care | Attending: Obstetrics & Gynecology

## 2019-02-27 ENCOUNTER — Inpatient Hospital Stay (HOSPITAL_COMMUNITY)
Admission: RE | Admit: 2019-02-27 | Discharge: 2019-03-01 | DRG: 788 | Disposition: A | Discharge status: Home / Self Care | Payer: Medicaid Other | Attending: Obstetrics & Gynecology | Admitting: Obstetrics & Gynecology

## 2019-02-27 ENCOUNTER — Encounter (HOSPITAL_COMMUNITY): Payer: Self-pay | Admitting: *Deleted

## 2019-02-27 ENCOUNTER — Other Ambulatory Visit: Payer: Self-pay

## 2019-02-27 DIAGNOSIS — Z3A37 37 weeks gestation of pregnancy: Secondary | ICD-10-CM

## 2019-02-27 DIAGNOSIS — O4403 Placenta previa specified as without hemorrhage, third trimester: Principal | ICD-10-CM | POA: Diagnosis present

## 2019-02-27 DIAGNOSIS — O4400 Placenta previa specified as without hemorrhage, unspecified trimester: Secondary | ICD-10-CM | POA: Diagnosis present

## 2019-02-27 LAB — PREPARE RBC (CROSSMATCH)

## 2019-02-27 SURGERY — Surgical Case
Anesthesia: Spinal | Site: Abdomen | Wound class: Clean Contaminated

## 2019-02-27 MED ORDER — DEXAMETHASONE SODIUM PHOSPHATE 4 MG/ML IJ SOLN
INTRAMUSCULAR | Status: DC | PRN
  Administered 2019-02-27: 4 mg via INTRAVENOUS

## 2019-02-27 MED ORDER — DIPHENHYDRAMINE HCL 25 MG PO CAPS
25.0000 mg | ORAL_CAPSULE | Freq: Four times a day (QID) | ORAL | Status: DC | PRN
  Administered 2019-02-27 – 2019-02-28 (×2): 25 mg via ORAL
  Filled 2019-02-27: qty 1

## 2019-02-27 MED ORDER — KETOROLAC TROMETHAMINE 30 MG/ML IJ SOLN: 30.0000 mg | Freq: Four times a day (QID) | INTRAMUSCULAR | Status: AC | PRN

## 2019-02-27 MED ORDER — SIMETHICONE 80 MG PO CHEW: 80.0000 mg | CHEWABLE_TABLET | ORAL | Status: DC | PRN

## 2019-02-27 MED ORDER — FAMOTIDINE IN NACL 20-0.9 MG/50ML-% IV SOLN
20.0000 mg | Freq: Once | INTRAVENOUS | Status: DC
  Filled 2019-02-27: qty 50

## 2019-02-27 MED ORDER — LACTATED RINGERS IV SOLN
INTRAVENOUS | Status: DC
  Administered 2019-02-28: via INTRAVENOUS

## 2019-02-27 MED ORDER — CEFAZOLIN SODIUM-DEXTROSE 2-4 GM/100ML-% IV SOLN: 2.0000 g | INTRAVENOUS | Status: DC

## 2019-02-27 MED ORDER — ACETAMINOPHEN 10 MG/ML IV SOLN
1000.0000 mg | Freq: Once | INTRAVENOUS | Status: DC | PRN
  Administered 2019-02-27: 14:00:00 1000 mg via INTRAVENOUS

## 2019-02-27 MED ORDER — KETOROLAC TROMETHAMINE 30 MG/ML IJ SOLN: 30.0000 mg | Freq: Once | INTRAMUSCULAR | Status: DC | PRN

## 2019-02-27 MED ORDER — COCONUT OIL OIL: 1.0000 "application " | TOPICAL_OIL | Status: DC | PRN

## 2019-02-27 MED ORDER — SCOPOLAMINE 1 MG/3DAYS TD PT72
1.0000 | MEDICATED_PATCH | Freq: Once | TRANSDERMAL | Status: DC
  Administered 2019-02-27: 11:00:00 1.5 mg via TRANSDERMAL

## 2019-02-27 MED ORDER — MEPERIDINE HCL 25 MG/ML IJ SOLN: 6.2500 mg | INTRAMUSCULAR | Status: DC | PRN

## 2019-02-27 MED ORDER — NALOXONE HCL 4 MG/10ML IJ SOLN
1.0000 ug/kg/h | INTRAVENOUS | Status: DC | PRN
  Filled 2019-02-27: qty 5

## 2019-02-27 MED ORDER — FENTANYL CITRATE (PF) 100 MCG/2ML IJ SOLN
INTRAMUSCULAR | Status: AC
  Filled 2019-02-27: qty 2

## 2019-02-27 MED ORDER — IBUPROFEN 600 MG PO TABS
600.0000 mg | ORAL_TABLET | Freq: Four times a day (QID) | ORAL | Status: DC
  Administered 2019-02-28 – 2019-03-01 (×3): 600 mg via ORAL
  Filled 2019-02-27 (×3): qty 1

## 2019-02-27 MED ORDER — SCOPOLAMINE 1 MG/3DAYS TD PT72
MEDICATED_PATCH | TRANSDERMAL | Status: AC
  Filled 2019-02-27: qty 1

## 2019-02-27 MED ORDER — SODIUM CHLORIDE 0.9 % IV SOLN
INTRAVENOUS | Status: DC | PRN
  Administered 2019-02-27: 13:00:00 via INTRAVENOUS

## 2019-02-27 MED ORDER — NALBUPHINE HCL 10 MG/ML IJ SOLN
5.0000 mg | INTRAMUSCULAR | Status: DC | PRN
  Filled 2019-02-27: qty 0.5

## 2019-02-27 MED ORDER — SIMETHICONE 80 MG PO CHEW
80.0000 mg | CHEWABLE_TABLET | Freq: Three times a day (TID) | ORAL | Status: DC
  Administered 2019-02-27 – 2019-03-01 (×5): 80 mg via ORAL
  Filled 2019-02-27 (×5): qty 1

## 2019-02-27 MED ORDER — NALOXONE HCL 0.4 MG/ML IJ SOLN: 0.4000 mg | INTRAMUSCULAR | Status: DC | PRN

## 2019-02-27 MED ORDER — DEXAMETHASONE SODIUM PHOSPHATE 4 MG/ML IJ SOLN
INTRAMUSCULAR | Status: AC
  Filled 2019-02-27: qty 3

## 2019-02-27 MED ORDER — FENTANYL CITRATE (PF) 100 MCG/2ML IJ SOLN: 25.0000 ug | INTRAMUSCULAR | Status: DC | PRN

## 2019-02-27 MED ORDER — ANTICOAGULANT SODIUM CITRATE 4% (200MG/5ML) IV SOLN: 5.0000 mL | Freq: Once | Status: DC

## 2019-02-27 MED ORDER — SENNOSIDES-DOCUSATE SODIUM 8.6-50 MG PO TABS
2.0000 | ORAL_TABLET | ORAL | Status: DC
  Administered 2019-02-27 – 2019-03-01 (×2): 2 via ORAL
  Filled 2019-02-27 (×2): qty 2

## 2019-02-27 MED ORDER — WITCH HAZEL-GLYCERIN EX PADS: 1.0000 "application " | MEDICATED_PAD | CUTANEOUS | Status: DC | PRN

## 2019-02-27 MED ORDER — NALBUPHINE HCL 10 MG/ML IJ SOLN
5.0000 mg | Freq: Once | INTRAMUSCULAR | Status: DC | PRN
  Filled 2019-02-27: qty 0.5

## 2019-02-27 MED ORDER — OXYTOCIN 10 UNIT/ML IJ SOLN
INTRAMUSCULAR | Status: DC | PRN
  Administered 2019-02-27: 40 [IU]

## 2019-02-27 MED ORDER — FENTANYL CITRATE (PF) 100 MCG/2ML IJ SOLN
INTRAMUSCULAR | Status: DC | PRN
  Administered 2019-02-27: 15 ug via INTRATHECAL

## 2019-02-27 MED ORDER — DIBUCAINE (PERIANAL) 1 % EX OINT: 1.0000 "application " | TOPICAL_OINTMENT | CUTANEOUS | Status: DC | PRN

## 2019-02-27 MED ORDER — FAMOTIDINE 20 MG PO TABS
ORAL_TABLET | ORAL | Status: AC
  Filled 2019-02-27: qty 1

## 2019-02-27 MED ORDER — DIPHENHYDRAMINE HCL 50 MG/ML IJ SOLN: 12.5000 mg | INTRAMUSCULAR | Status: DC | PRN

## 2019-02-27 MED ORDER — LACTATED RINGERS IV SOLN
INTRAVENOUS | Status: DC
  Administered 2019-02-27 (×3): via INTRAVENOUS

## 2019-02-27 MED ORDER — MORPHINE SULFATE (PF) 0.5 MG/ML IJ SOLN
INTRAMUSCULAR | Status: DC | PRN
  Administered 2019-02-27: .15 mg via EPIDURAL

## 2019-02-27 MED ORDER — SODIUM CHLORIDE 0.9 % IR SOLN
Status: DC | PRN
  Administered 2019-02-27: 1000 mL

## 2019-02-27 MED ORDER — BUPIVACAINE IN DEXTROSE 0.75-8.25 % IT SOLN
INTRATHECAL | Status: DC | PRN
  Administered 2019-02-27: 1.6 mL via INTRATHECAL

## 2019-02-27 MED ORDER — CEFAZOLIN SODIUM-DEXTROSE 2-4 GM/100ML-% IV SOLN
INTRAVENOUS | Status: AC
  Filled 2019-02-27: qty 100

## 2019-02-27 MED ORDER — SIMETHICONE 80 MG PO CHEW
80.0000 mg | CHEWABLE_TABLET | ORAL | Status: DC
  Administered 2019-02-27 – 2019-03-01 (×2): 80 mg via ORAL
  Filled 2019-02-27 (×2): qty 1

## 2019-02-27 MED ORDER — ONDANSETRON HCL 4 MG/2ML IJ SOLN
INTRAMUSCULAR | Status: AC
  Filled 2019-02-27: qty 2

## 2019-02-27 MED ORDER — SODIUM CHLORIDE 0.9% FLUSH: 3.0000 mL | INTRAVENOUS | Status: DC | PRN

## 2019-02-27 MED ORDER — OXYCODONE HCL 5 MG PO TABS
5.0000 mg | ORAL_TABLET | ORAL | Status: DC | PRN
  Administered 2019-03-01: 10:00:00 5 mg via ORAL
  Filled 2019-02-27: qty 1

## 2019-02-27 MED ORDER — METOCLOPRAMIDE HCL 5 MG/ML IJ SOLN
INTRAMUSCULAR | Status: AC
  Filled 2019-02-27: qty 2

## 2019-02-27 MED ORDER — STERILE WATER FOR IRRIGATION IR SOLN
Status: DC | PRN
  Administered 2019-02-27: 1000 mL

## 2019-02-27 MED ORDER — SOD CITRATE-CITRIC ACID 500-334 MG/5ML PO SOLN
30.0000 mL | Freq: Once | ORAL | Status: AC
  Administered 2019-02-27: 11:00:00 30 mL via ORAL

## 2019-02-27 MED ORDER — ACETAMINOPHEN 325 MG PO TABS: 650.0000 mg | ORAL_TABLET | ORAL | Status: DC | PRN

## 2019-02-27 MED ORDER — TETANUS-DIPHTH-ACELL PERTUSSIS 5-2.5-18.5 LF-MCG/0.5 IM SUSP: 0.5000 mL | Freq: Once | INTRAMUSCULAR | Status: DC

## 2019-02-27 MED ORDER — KETOROLAC TROMETHAMINE 30 MG/ML IJ SOLN
30.0000 mg | Freq: Four times a day (QID) | INTRAMUSCULAR | Status: AC
  Administered 2019-02-27 – 2019-02-28 (×4): 30 mg via INTRAVENOUS
  Filled 2019-02-27 (×4): qty 1

## 2019-02-27 MED ORDER — PRENATAL MULTIVITAMIN CH
1.0000 | ORAL_TABLET | Freq: Every day | ORAL | Status: DC
  Administered 2019-02-28: 12:00:00 1 via ORAL
  Filled 2019-02-27: qty 1

## 2019-02-27 MED ORDER — PHENYLEPHRINE HCL-NACL 20-0.9 MG/250ML-% IV SOLN
INTRAVENOUS | Status: AC
  Filled 2019-02-27: qty 250

## 2019-02-27 MED ORDER — MENTHOL 3 MG MT LOZG: 1.0000 | LOZENGE | OROMUCOSAL | Status: DC | PRN

## 2019-02-27 MED ORDER — METOCLOPRAMIDE HCL 5 MG/ML IJ SOLN
INTRAMUSCULAR | Status: DC | PRN
  Administered 2019-02-27: 10 mg via INTRAVENOUS

## 2019-02-27 MED ORDER — OXYTOCIN 40 UNITS IN NORMAL SALINE INFUSION - SIMPLE MED: 2.5000 [IU]/h | INTRAVENOUS | Status: AC

## 2019-02-27 MED ORDER — CEFAZOLIN SODIUM-DEXTROSE 2-3 GM-%(50ML) IV SOLR
INTRAVENOUS | Status: DC | PRN
  Administered 2019-02-27: 2 g via INTRAVENOUS

## 2019-02-27 MED ORDER — FAMOTIDINE 20 MG PO TABS
20.0000 mg | ORAL_TABLET | Freq: Once | ORAL | Status: AC
  Administered 2019-02-27: 20 mg via ORAL

## 2019-02-27 MED ORDER — ONDANSETRON HCL 4 MG/2ML IJ SOLN
INTRAMUSCULAR | Status: DC | PRN
  Administered 2019-02-27: 4 mg via INTRAVENOUS

## 2019-02-27 MED ORDER — MORPHINE SULFATE (PF) 0.5 MG/ML IJ SOLN
INTRAMUSCULAR | Status: AC
  Filled 2019-02-27: qty 10

## 2019-02-27 MED ORDER — ACETAMINOPHEN 10 MG/ML IV SOLN
INTRAVENOUS | Status: AC
  Filled 2019-02-27: qty 100

## 2019-02-27 MED ORDER — DIPHENHYDRAMINE HCL 25 MG PO CAPS
25.0000 mg | ORAL_CAPSULE | ORAL | Status: DC | PRN
  Filled 2019-02-27: qty 1

## 2019-02-27 MED ORDER — SODIUM CHLORIDE 0.9 % IV SOLN
INTRAVENOUS | Status: DC | PRN
  Administered 2019-02-27: 60 ug/min via INTRAVENOUS

## 2019-02-27 MED ORDER — ZOLPIDEM TARTRATE 5 MG PO TABS: 5.0000 mg | ORAL_TABLET | Freq: Every evening | ORAL | Status: DC | PRN

## 2019-02-27 MED ORDER — ONDANSETRON HCL 4 MG/2ML IJ SOLN: 4.0000 mg | Freq: Three times a day (TID) | INTRAMUSCULAR | Status: DC | PRN

## 2019-02-27 MED ORDER — SOD CITRATE-CITRIC ACID 500-334 MG/5ML PO SOLN
ORAL | Status: AC
  Filled 2019-02-27: qty 30

## 2019-02-27 MED ORDER — PHENYLEPHRINE 40 MCG/ML (10ML) SYRINGE FOR IV PUSH (FOR BLOOD PRESSURE SUPPORT)
PREFILLED_SYRINGE | INTRAVENOUS | Status: AC
  Filled 2019-02-27: qty 10

## 2019-02-27 MED ORDER — OXYTOCIN 40 UNITS IN NORMAL SALINE INFUSION - SIMPLE MED
INTRAVENOUS | Status: AC
  Filled 2019-02-27: qty 1000

## 2019-02-27 MED ORDER — ACETAMINOPHEN 500 MG PO TABS
1000.0000 mg | ORAL_TABLET | Freq: Four times a day (QID) | ORAL | Status: AC
  Administered 2019-02-27 – 2019-02-28 (×3): 1000 mg via ORAL
  Filled 2019-02-27 (×3): qty 2

## 2019-02-27 SURGICAL SUPPLY — 33 items
APL SKNCLS STERI-STRIP NONHPOA (GAUZE/BANDAGES/DRESSINGS) ×1
BARRIER ADHS 3X4 INTERCEED (GAUZE/BANDAGES/DRESSINGS) ×2 IMPLANT
BENZOIN TINCTURE PRP APPL 2/3 (GAUZE/BANDAGES/DRESSINGS) ×3 IMPLANT
BRR ADH 4X3 ABS CNTRL BYND (GAUZE/BANDAGES/DRESSINGS) ×1
CHLORAPREP W/TINT 26ML (MISCELLANEOUS) ×3 IMPLANT
CLAMP CORD UMBIL (MISCELLANEOUS) ×2 IMPLANT
CLOSURE WOUND 1/2 X4 (GAUZE/BANDAGES/DRESSINGS) ×1
CLOTH BEACON ORANGE TIMEOUT ST (SAFETY) ×3 IMPLANT
DRSG OPSITE POSTOP 4X10 (GAUZE/BANDAGES/DRESSINGS) ×3 IMPLANT
ELECT REM PT RETURN 9FT ADLT (ELECTROSURGICAL) ×3
ELECTRODE REM PT RTRN 9FT ADLT (ELECTROSURGICAL) ×1 IMPLANT
EXTRACTOR VACUUM KIWI (MISCELLANEOUS) ×2 IMPLANT
GLOVE BIOGEL PI IND STRL 7.0 (GLOVE) ×3 IMPLANT
GLOVE BIOGEL PI INDICATOR 7.0 (GLOVE) ×6
GLOVE ECLIPSE 6.5 STRL STRAW (GLOVE) ×3 IMPLANT
GOWN STRL REUS W/TWL LRG LVL3 (GOWN DISPOSABLE) ×9 IMPLANT
NS IRRIG 1000ML POUR BTL (IV SOLUTION) ×3 IMPLANT
PACK C SECTION WH (CUSTOM PROCEDURE TRAY) ×3 IMPLANT
PAD ABD 7.5X8 STRL (GAUZE/BANDAGES/DRESSINGS) ×3 IMPLANT
PAD OB MATERNITY 4.3X12.25 (PERSONAL CARE ITEMS) ×3 IMPLANT
PENCIL SMOKE EVAC W/HOLSTER (ELECTROSURGICAL) ×3 IMPLANT
RTRCTR C-SECT PINK 25CM LRG (MISCELLANEOUS) ×3 IMPLANT
STRIP CLOSURE SKIN 1/2X4 (GAUZE/BANDAGES/DRESSINGS) ×2 IMPLANT
SUT VIC AB 0 CT1 27 (SUTURE) ×6
SUT VIC AB 0 CT1 27XBRD ANBCTR (SUTURE) ×2 IMPLANT
SUT VIC AB 0 CTX 36 (SUTURE) ×9
SUT VIC AB 0 CTX36XBRD ANBCTRL (SUTURE) ×3 IMPLANT
SUT VIC AB 2-0 CT1 27 (SUTURE) ×3
SUT VIC AB 2-0 CT1 TAPERPNT 27 (SUTURE) ×1 IMPLANT
SUT VIC AB 4-0 KS 27 (SUTURE) ×3 IMPLANT
TOWEL OR 17X24 6PK STRL BLUE (TOWEL DISPOSABLE) ×3 IMPLANT
TRAY FOLEY W/BAG SLVR 14FR LF (SET/KITS/TRAYS/PACK) ×2 IMPLANT
WATER STERILE IRR 1000ML POUR (IV SOLUTION) ×3 IMPLANT

## 2019-02-27 NOTE — Anesthesia Postprocedure Evaluation (Signed)
Anesthesia Post Note  Patient: Tanya Gregory  Procedure(s) Performed: CESAREAN SECTION (N/A Abdomen)     Patient location during evaluation: PACU Anesthesia Type: Spinal Level of consciousness: oriented and awake and alert Pain management: pain level controlled Vital Signs Assessment: post-procedure vital signs reviewed and stable Respiratory status: spontaneous breathing, respiratory function stable and patient connected to nasal cannula oxygen Cardiovascular status: blood pressure returned to baseline and stable Postop Assessment: no headache, no backache and no apparent nausea or vomiting Anesthetic complications: no    Last Vitals:  Vitals:   02/27/19 1512 02/27/19 1615  BP: 109/70 107/68  Pulse: 77 74  Resp: 16 16  Temp: 37 C 37 C  SpO2: 98% 97%    Last Pain:  Vitals:   02/27/19 1615  TempSrc: Oral  PainSc: 1    Pain Goal:                Epidural/Spinal Function Cutaneous sensation: Normal sensation (02/27/19 1615), Patient able to flex knees: Yes (02/27/19 1615), Patient able to lift hips off bed: Yes (02/27/19 1615), Back pain beyond tenderness at insertion site: No (02/27/19 1615), Progressively worsening motor and/or sensory loss: No (02/27/19 1615), Bowel and/or bladder incontinence post epidural: No (02/27/19 1615)  Abed Schar L Ashton Belote

## 2019-02-27 NOTE — Anesthesia Procedure Notes (Signed)
Spinal  Patient location during procedure: OR Start time: 02/27/2019 12:27 PM End time: 02/27/2019 12:37 PM Staffing Anesthesiologist: Freddrick March, MD Performed: anesthesiologist  Preanesthetic Checklist Completed: patient identified, surgical consent, pre-op evaluation, timeout performed, IV checked, risks and benefits discussed and monitors and equipment checked Spinal Block Patient position: sitting Prep: site prepped and draped and DuraPrep Patient monitoring: cardiac monitor, continuous pulse ox and blood pressure Approach: midline Location: L3-4 Injection technique: single-shot Needle Needle type: Pencan  Needle gauge: 24 G Needle length: 9 cm Assessment Sensory level: T6 Additional Notes Functioning IV was confirmed and monitors were applied. Sterile prep and drape, including hand hygiene and sterile gloves were used. The patient was positioned and the spine was prepped. The skin was anesthetized with lidocaine.  Free flow of clear CSF was obtained prior to injecting local anesthetic into the CSF.  The spinal needle aspirated freely following injection.  The needle was carefully withdrawn.  The patient tolerated the procedure well.

## 2019-02-27 NOTE — Anesthesia Preprocedure Evaluation (Signed)
Anesthesia Evaluation  Patient identified by MRN, date of birth, ID band Patient awake    Reviewed: Allergy & Precautions, NPO status , Patient's Chart, lab work & pertinent test results  Airway Mallampati: II  TM Distance: >3 FB Neck ROM: Full    Dental no notable dental hx.    Pulmonary neg pulmonary ROS,    Pulmonary exam normal breath sounds clear to auscultation       Cardiovascular negative cardio ROS Normal cardiovascular exam Rhythm:Regular Rate:Normal     Neuro/Psych negative neurological ROS  negative psych ROS   GI/Hepatic negative GI ROS, Neg liver ROS,   Endo/Other  negative endocrine ROS  Renal/GU negative Renal ROS  negative genitourinary   Musculoskeletal negative musculoskeletal ROS (+)   Abdominal   Peds  Hematology negative hematology ROS (+)   Anesthesia Other Findings C/S for placenta previa, Hgb 13.1  Reproductive/Obstetrics (+) Pregnancy                             Anesthesia Physical Anesthesia Plan  ASA: II  Anesthesia Plan: Spinal   Post-op Pain Management:    Induction:   PONV Risk Score and Plan: Treatment may vary due to age or medical condition  Airway Management Planned: Natural Airway  Additional Equipment:   Intra-op Plan:   Post-operative Plan:   Informed Consent: I have reviewed the patients History and Physical, chart, labs and discussed the procedure including the risks, benefits and alternatives for the proposed anesthesia with the patient or authorized representative who has indicated his/her understanding and acceptance.     Dental advisory given  Plan Discussed with: CRNA  Anesthesia Plan Comments:         Anesthesia Quick Evaluation

## 2019-02-27 NOTE — Transfer of Care (Signed)
Immediate Anesthesia Transfer of Care Note  Patient: Tanya Gregory  Procedure(s) Performed: CESAREAN SECTION (N/A )  Patient Location: PACU  Anesthesia Type:Spinal  Level of Consciousness: awake, alert  and oriented  Airway & Oxygen Therapy: Patient Spontanous Breathing  Post-op Assessment: Report given to RN and Post -op Vital signs reviewed and stable  Post vital signs: Reviewed and stable  Last Vitals:  Vitals Value Taken Time  BP 137/70 02/27/19 1358  Temp    Pulse 78 02/27/19 1400  Resp 20 02/27/19 1400  SpO2 100 % 02/27/19 1400  Vitals shown include unvalidated device data.  Last Pain:  Vitals:   02/27/19 1102  TempSrc: Oral         Complications: No apparent anesthesia complications

## 2019-02-27 NOTE — Lactation Note (Signed)
This note was copied from a baby's chart. Lactation Consultation Note  Patient Name: Tanya Gregory OZHYQ'M Date: 02/27/2019 Reason for consult: Initial assessment;Early term 37-38.6wks;Primapara;1st time breastfeeding  P3 mother whose infant is now 14 hours old.  This is an ETI at 37+2 weeks weighing >6 lbs.  Mother breast fed her first child (now 36 years old) for 14 months and her second child (now 55 months) for 13 months.  Mother had no questions/concerns related to breast feeding.  She remembers feeding cues and hand expression and did not wish to review.  She feels very confident in her breast feeding ability and declined any help at this time.  Mother had just finished feeding him prior to my arrival and he was asleep in her arms.  Reviewed the LPTI policy with her.  Encouraged lots of STS and to awaken baby every three hours if he does not self awaken. Offered to begin pumping with the DEBP but mother was not interested.  She feels confident that baby is doing well.  I informed her that this was always an option if she would like to begin.  Mother verbalized understanding.    Mom made aware of O/P services, breastfeeding support groups, community resources, and our phone # for post-discharge questions.  Mother has a DEBP for home use.  Father present.   Maternal Data Formula Feeding for Exclusion: No Has patient been taught Hand Expression?: Yes Does the patient have breastfeeding experience prior to this delivery?: Yes  Feeding Feeding Type: Breast Fed  LATCH Score                   Interventions    Lactation Tools Discussed/Used     Consult Status Consult Status: Follow-up Date: 02/28/19 Follow-up type: In-patient    [name] 02/27/2019, 6:40 PM

## 2019-02-27 NOTE — Op Note (Signed)
PreOp Diagnosis: 1) Intrauterine pregnancy @ [redacted]w[redacted]d 2) Placenta previa PostOp Diagnosis: same Procedure: Primary C-section Surgeon: Dr. Janyth Pupa Assistant: Dr. Cyndia Skeeters Anesthesia: spinal Complications: none EBL: 413cc UOP: 250cc Fluids: 3400cc  Findings: Female infant from vertex presentation, tight nuchal cord x 1, posterior placenta previa, normal uterus, tubes and ovaries bilaterally  PROCEDURE:  Informed consent was obtained from the patient with risks, benefits, complications, treatment options, and expected outcomes discussed with the patient.  The patient concurred with the proposed plan, giving informed consent with form signed.   The patient was taken to Operating Room, and identified with the procedure verified as C-Section Delivery with Time Out. With induction of anesthesia, the patient was prepped and draped in the usual sterile fashion. A Pfannenstiel incision was made and carried down through the subcutaneous tissue to the fascia. The fascia was incised in the midline and extended transversely. The superior aspect of the fascial incision was grasped with Kochers elevated and the underlying muscle dissected off. The inferior aspect of the facial incision was in similar fashion, grasped elevated and rectus muscles dissected off. The peritoneum was identified and entered. Peritoneal incision was extended longitudinally. The utero-vesical peritoneal reflection was identified and incised transversely with the [facility] scissors, the incision extended laterally, the bladder flap created digitally. A low transverse uterine incision was made and the infant's head delivered atraumatically. After the umbilical cord was clamped and cut cord blood was obtained for evaluation.   The placenta was removed intact and appeared normal. The uterine outline, tubes and ovaries appeared normal. The uterine incision was closed with running locked sutures of 0 Vicryl and a second layer of the same  stitch was used in an imbricating fashion.  Excellent hemostasis was obtained.  The pericolic gutters were then cleared of all clots and debris. Interceed was placed.  The peritoneum was closed in a running fashion in a 2-0 fashion.  The fascia was then reapproximated with running sutures of 0 Vicryl. The subcutaneous tissue was reapproximated with 2-0 plain gut suture.  The skin was closed with 4-0 vicryl in a subcuticular fashion.  Instrument, sponge, and needle counts were correct prior the abdominal closure and at the conclusion of the case. The patient was taken to recovery in stable condition.  Janyth Pupa, DO [phone] (cell) [phone] (office)

## 2019-02-28 LAB — CBC
HCT: 31.7 % — ABNORMAL LOW (ref 36.0–46.0)
Hemoglobin: 11.1 g/dL — ABNORMAL LOW (ref 12.0–15.0)
MCH: 32.3 pg (ref 26.0–34.0)
MCHC: 35 g/dL (ref 30.0–36.0)
MCV: 92.2 fL (ref 80.0–100.0)
Platelets: 190 10*3/uL (ref 150–400)
RBC: 3.44 MIL/uL — ABNORMAL LOW (ref 3.87–5.11)
RDW: 13 % (ref 11.5–15.5)
WBC: 12.6 10*3/uL — ABNORMAL HIGH (ref 4.0–10.5)
nRBC: 0 % (ref 0.0–0.2)

## 2019-02-28 MED ORDER — FAMOTIDINE 20 MG PO TABS
10.0000 mg | ORAL_TABLET | Freq: Two times a day (BID) | ORAL | Status: DC | PRN
  Administered 2019-02-28 – 2019-03-01 (×2): 10 mg via ORAL
  Filled 2019-02-28 (×2): qty 1

## 2019-02-28 MED ORDER — CALCIUM CARBONATE ANTACID 500 MG PO CHEW: 1.0000 | CHEWABLE_TABLET | ORAL | Status: DC | PRN

## 2019-02-28 NOTE — Lactation Note (Signed)
This note was copied from a baby's chart. Lactation Consultation Note  Patient Name: Tanya Gregory YVOPF'Y Date: 02/28/2019 Reason for consult: Follow-up assessment(early term)  Baby Tanya now 72 hours old.  Mom reports they are breastfeeding well.  Denies need.  Mom is an experienced breastfeeding mom.  Urged to call lactation as needed.    Feeding Feeding Type: Breast Fed  LATCH Score                   Interventions Interventions: Breast feeding basics reviewed  Lactation Tools Discussed/Used     Consult Status Consult Status: Follow-up Date: 03/01/19    Rollen Sox 02/28/2019, 6:34 PM

## 2019-02-28 NOTE — Progress Notes (Signed)
Postop Note Day # 1  S:  Patient resting comfortable in bed.  Pain controlled.  Tolerating general diet. No flatus, no BM.  Lochia moderate.  Ambulating without difficulty.  She denies n/v/f/c, SOB, or CP.  Pt plans on breastfeeding.  O: Temp:  [97.4 F (36.3 C)-99 F (37.2 C)] 97.8 F (36.6 C) (10/06 0616) Pulse Rate:  [61-94] 61 (10/06 0616) Resp:  [16-27] 18 (10/06 0616) BP: (102-137)/(65-82) 110/67 (10/06 0616) SpO2:  [97 %-100 %] 97 % (10/06 0616) Weight:  [84 kg] 84 kg (10/05 1052)   Gen: A&Ox3, NAD CV: RRR, no MRG Resp: CTAB Abdomen: soft, NT, ND +BS Uterus: firm, non-tender, below umbilicus Incision: c/d/i, bandage on Ext: 1+ non-pitting edema, no calf tenderness bilaterally, SCDs in place  Labs: pending  A/P: Pt is a 38 y.o. H7C1638 s/p primary C-section due to placenta previa, POD#1  - Pain well controlled -GU: UOP is adequate, foley removed this am -GI: Tolerating general diet -Activity: encouraged sitting up to chair and ambulation as tolerated -Prophylaxis: SCDs while in bed -Labs: CBC pending this am -Outpatient circ  Continue with routine postop care, likely plan for early discharge home tomorrow  Janyth Pupa, DO [phone] (cell) [phone] (office)

## 2019-03-01 ENCOUNTER — Encounter (HOSPITAL_COMMUNITY): Payer: Self-pay | Admitting: Obstetrics & Gynecology

## 2019-03-01 LAB — BPAM RBC
Blood Product Expiration Date: 202010212359
Blood Product Expiration Date: 202010212359
ISSUE DATE / TIME: 202010051104
ISSUE DATE / TIME: 202010051104
Unit Type and Rh: 6200
Unit Type and Rh: 6200

## 2019-03-01 LAB — TYPE AND SCREEN
ABO/RH(D): A POS
Antibody Screen: NEGATIVE
Unit division: 0
Unit division: 0

## 2019-03-01 LAB — SURGICAL PATHOLOGY

## 2019-03-01 MED ORDER — FAMOTIDINE 10 MG PO TABS: 10.0000 mg | ORAL_TABLET | Freq: Two times a day (BID) | ORAL | 1 refills | Status: DC | PRN

## 2019-03-01 MED ORDER — ACETAMINOPHEN 325 MG PO TABS: 650.0000 mg | ORAL_TABLET | Freq: Four times a day (QID) | ORAL | 1 refills | Status: DC | PRN

## 2019-03-01 MED ORDER — IBUPROFEN 600 MG PO TABS: 600.0000 mg | ORAL_TABLET | Freq: Four times a day (QID) | ORAL | 0 refills | Status: DC | PRN

## 2019-03-01 MED ORDER — DOCUSATE SODIUM 100 MG PO CAPS: 100.0000 mg | ORAL_CAPSULE | Freq: Two times a day (BID) | ORAL | 0 refills | Status: DC

## 2019-03-01 MED ORDER — OXYCODONE HCL 5 MG PO TABS: 5.0000 mg | ORAL_TABLET | Freq: Four times a day (QID) | ORAL | 0 refills | Status: AC | PRN

## 2019-03-01 NOTE — Discharge Instructions (Signed)
Cesarean Delivery, Care After This sheet gives you information about how to care for yourself after your procedure. Your health care provider may also give you more specific instructions. If you have problems or questions, contact your health care provider. What can I expect after the procedure? After the procedure, it is common to have:  A small amount of blood or clear fluid coming from the incision.  Some redness, swelling, and pain in your incision area.  Some abdominal pain and soreness.  Vaginal bleeding (lochia). Even though you did not have a vaginal delivery, you will still have vaginal bleeding and discharge.  Pelvic cramps.  Fatigue. You may have pain, swelling, and discomfort in the tissue between your vagina and your anus (perineum) if:  Your C-section was unplanned, and you were allowed to labor and push.  An incision was made in the area (episiotomy) or the tissue tore during attempted vaginal delivery. Follow these instructions at home: Incision care   Follow instructions from your health care provider about how to take care of your incision. Make sure you: ? Wash your hands with soap and water before you change your bandage (dressing). If soap and water are not available, use hand sanitizer. ? If you have a dressing, change it or remove it as told by your health care provider. ? Leave stitches (sutures), skin staples, skin glue, or adhesive strips in place. These skin closures may need to stay in place for 2 weeks or longer. If adhesive strip edges start to loosen and curl up, you may trim the loose edges. Do not remove adhesive strips completely unless your health care provider tells you to do that.  Check your incision area every day for signs of infection. Check for: ? More redness, swelling, or pain. ? More fluid or blood. ? Warmth. ? Pus or a bad smell.  Do not take baths, swim, or use a hot tub until your health care provider says it's okay. Ask your health  care provider if you can take showers.  When you cough or sneeze, hug a pillow. This helps with pain and decreases the chance of your incision opening up (dehiscing). Do this until your incision heals. Medicines  Take over-the-counter and prescription medicines only as told by your health care provider.  For pain medicine you may alternate between ibuprofen and tylenol (acetaminophen).  For moderate to severe pain take the oxycodone along with the tylenol.  This medication may cause constipation so be sure to continue with the stool softener twice daily while on this medication.  If you were prescribed an antibiotic medicine, take it as told by your health care provider. Do not stop taking the antibiotic even if you start to feel better.  Do not drive or use heavy machinery while taking prescription pain medicine. Lifestyle  Do not drink alcohol. This is especially important if you are breastfeeding or taking pain medicine.  Do not use any products that contain nicotine or tobacco, such as cigarettes, e-cigarettes, and chewing tobacco. If you need help quitting, ask your health care provider. Eating and drinking  Drink at least 8 eight-ounce glasses of water every day unless told not to by your health care provider. If you breastfeed, you may need to drink even more water.  Eat high-fiber foods every day. These foods may help prevent or relieve constipation. High-fiber foods include: ? Whole grain cereals and breads. ? Brown rice. ? Beans. ? Fresh fruits and vegetables. Activity   If possible, have  someone help you care for your baby and help with household activities for at least a few days after you leave the hospital.  Return to your normal activities as told by your health care provider. Ask your health care provider what activities are safe for you.  Rest as much as possible. Try to rest or take a nap while your baby is sleeping.  Do not lift anything that is heavier than 10 lbs  (4.5 kg), or the limit that you were told, until your health care provider says that it is safe.  Talk with your health care provider about when you can engage in sexual activity. This may depend on your: ? Risk of infection. ? How fast you heal. ? Comfort and desire to engage in sexual activity. General instructions  Do not use tampons or douches until your health care provider approves.  Wear loose, comfortable clothing and a supportive and well-fitting bra.  Keep your perineum clean and dry. Wipe from front to back when you use the toilet.  If you pass a blood clot, save it and call your health care provider to discuss. Do not flush blood clots down the toilet before you get instructions from your health care provider.  Keep all follow-up visits for you and your baby as told by your health care provider. This is important. Contact a health care provider if:  You have: ? A fever. ? Bad-smelling vaginal discharge. ? Pus or a bad smell coming from your incision. ? Difficulty or pain when urinating. ? A sudden increase or decrease in the frequency of your bowel movements. ? More redness, swelling, or pain around your incision. ? More fluid or blood coming from your incision. ? A rash. ? Nausea. ? Little or no interest in activities you used to enjoy. ? Questions about caring for yourself or your baby.  Your incision feels warm to the touch.  Your breasts turn red or become painful or hard.  You feel unusually sad or worried.  You vomit.  You pass a blood clot from your vagina.  You urinate more than usual.  You are dizzy or light-headed. Get help right away if:  You have: ? Pain that does not go away or get better with medicine. ? Chest pain. ? Difficulty breathing. ? Blurred vision or spots in your vision. ? Thoughts about hurting yourself or your baby. ? New pain in your abdomen or in one of your legs. ? A severe headache.  You faint.  You bleed from your  vagina so much that you fill more than one sanitary pad in one hour. Bleeding should not be heavier than your heaviest period. Summary  After the procedure, it is common to have pain at your incision site, abdominal cramping, and slight bleeding from your vagina.  Check your incision area every day for signs of infection.  Tell your health care provider about any unusual symptoms.  Keep all follow-up visits for you and your baby as told by your health care provider. This information is not intended to replace advice given to you by your health care provider. Make sure you discuss any questions you have with your health care provider. Document Released: 01/31/2002 Document Revised: 11/17/2017 Document Reviewed: 11/17/2017 Elsevier Patient Education  2020 [employer].

## 2019-03-01 NOTE — Discharge Summary (Signed)
OB Discharge Summary     Patient Name: Tanya Gregory DOB: [birth year] MRN: [id number]  Date of admission: 02/27/2019 Delivering MD: Myna Hidalgo   Date of discharge: 03/01/2019  Admitting diagnosis: O44.00 placenta previa antepartum Intrauterine pregnancy: [redacted]w[redacted]d     Secondary diagnosis:  Active Problems:   Placenta previa affecting delivery  Additional problems: none     Discharge diagnosis: Term Pregnancy Delivered and placenta previa                                                                                                Post partum procedures:none  Augmentation: n/a  Complications: None  Hospital course:  Sceduled C/S   38 y.o. yo [id number] at [redacted]w[redacted]d was admitted to the hospital 02/27/2019 for scheduled cesarean section with the following indication:Previa.  Membrane Rupture Time/Date: 1:00 PM ,02/27/2019   Patient delivered a Viable infant.02/27/2019  Details of operation can be found in separate operative note.  Pateint had an uncomplicated postpartum course.  She is ambulating, tolerating a regular diet, passing flatus, and urinating well. Patient is discharged home in stable condition on  03/01/19         Physical exam  Vitals:   02/28/19 0804 02/28/19 1349 02/28/19 2116 03/01/19 0607  BP: 109/71 109/76 108/66 98/69  Pulse: 66 79 69 60  Resp: 16 18 16 18   Temp: 98 F (36.7 C)  98.3 F (36.8 C) 98.5 F (36.9 C)  TempSrc: Oral  Oral Oral  SpO2: 99% 100% 99% 98%  Weight:      Height:       General: alert, cooperative and no distress Lochia: appropriate Uterine Fundus: firm Incision: Dressing is clean, dry, and intact DVT Evaluation: No evidence of DVT seen on physical exam. Labs: Lab Results  Component Value Date   WBC 12.6 (H) 02/28/2019   HGB 11.1 (L) 02/28/2019   HCT 31.7 (L) 02/28/2019   MCV 92.2 02/28/2019   PLT 190 02/28/2019   CMP Latest Ref Rng & Units 08/02/2017  Glucose 65 - 99 mg/dL 76  BUN 6 - 20 mg/dL 9  Creatinine 10/02/2017 - 7.12  mg/dL 4.58  Sodium 0.99 - 833 mmol/L 132(L)  Potassium 3.5 - 5.1 mmol/L 3.8  Chloride 101 - 111 mmol/L 102  CO2 22 - 32 mmol/L 19(L)  Calcium 8.9 - 10.3 mg/dL 8.9  Total Protein 6.5 - 8.1 g/dL 6.3(L)  Total Bilirubin 0.3 - 1.2 mg/dL 0.8  Alkaline Phos 38 - 126 U/L 265(H)  AST 15 - 41 U/L 21  ALT 14 - 54 U/L 13(L)    Discharge instruction: per After Visit Summary and "Baby and Me Booklet".  After visit meds:  Allergies as of 03/01/2019   No Known Allergies     Medication List    TAKE these medications   acetaminophen 325 MG tablet Commonly known as: TYLENOL Take 2 tablets (650 mg total) by mouth every 6 (six) hours as needed for mild pain or moderate pain (temperature > 101.5.).   calcium carbonate 500 MG chewable tablet Commonly known as: TUMS - dosed in mg elemental calcium  Chew 1-2 tablets by mouth daily as needed for indigestion or heartburn.   docusate sodium 100 MG capsule Commonly known as: Colace Take 1 capsule (100 mg total) by mouth 2 (two) times daily.   famotidine 10 MG tablet Commonly known as: PEPCID Take 1 tablet (10 mg total) by mouth 2 (two) times daily as needed for heartburn or indigestion.   ibuprofen 600 MG tablet Commonly known as: ADVIL Take 1 tablet (600 mg total) by mouth every 6 (six) hours as needed.   oxyCODONE 5 MG immediate release tablet Commonly known as: Oxy IR/ROXICODONE Take 1 tablet (5 mg total) by mouth every 6 (six) hours as needed for up to 7 days for moderate pain, severe pain or breakthrough pain.   prenatal multivitamin Tabs tablet Take 1 tablet by mouth daily at 12 noon.       Diet: routine diet  Activity: Advance as tolerated. Pelvic rest for 6 weeks.   Outpatient follow up:2 weeks Follow up Appt:No future appointments. Follow up Visit:No follow-ups on file.  Postpartum contraception: Not Discussed  Newborn Data: Live born female  Birth Weight: 8 lb 3.9 oz (3739 g) APGAR: 8, 9  Newborn Delivery   Birth  date/time: 02/27/2019 13:01:00 Delivery type: C-Section, Vacuum Assisted Trial of labor: No C-section categorization: Primary      Baby Feeding: Breast Disposition:home with mother   03/01/2019 Tanya Genta, DO

## 2019-03-01 NOTE — Lactation Note (Signed)
This note was copied from a baby's chart. Lactation Consultation Note  Patient Name: Tanya Gregory [id number] Date: 03/01/2019 Reason for consult: Follow-up assessment;Early term 23-38.6wks Baby is 45 hours old/5% weight loss.  Mom states feedings are going well.  Baby has been cluster feeding and right nipple sore.  She will ask her nurse for coconut oil.  No questions or concerns.  Reviewed outpatient services and encouraged to call prn.  Maternal Data    Feeding    LATCH Score                   Interventions    Lactation Tools Discussed/Used     Consult Status Consult Status: Complete Follow-up type: Call as needed    Ave Filter 03/01/2019, 10:22 AM

## 2019-03-06 ENCOUNTER — Telehealth (HOSPITAL_COMMUNITY): Payer: Self-pay | Admitting: Lactation Services

## 2019-03-06 NOTE — Telephone Encounter (Signed)
This patient called for [facility] O/P appt and this [place] returned the call and directed mom to call the [phone] phine number for LC appt.  Enc mom to call back if questions.

## 2019-05-23 ENCOUNTER — Ambulatory Visit: Payer: Medicaid Other | Admitting: Physical Therapy

## 2019-05-30 ENCOUNTER — Other Ambulatory Visit: Payer: Self-pay

## 2019-05-30 ENCOUNTER — Ambulatory Visit: Payer: Medicaid Other | Attending: Obstetrics & Gynecology | Admitting: Physical Therapy

## 2019-05-30 ENCOUNTER — Encounter: Payer: Self-pay | Admitting: Physical Therapy

## 2019-05-30 DIAGNOSIS — G8929 Other chronic pain: Secondary | ICD-10-CM | POA: Diagnosis present

## 2019-05-30 DIAGNOSIS — M6281 Muscle weakness (generalized): Secondary | ICD-10-CM | POA: Diagnosis present

## 2019-05-30 DIAGNOSIS — M533 Sacrococcygeal disorders, not elsewhere classified: Secondary | ICD-10-CM

## 2019-05-30 DIAGNOSIS — M545 Low back pain, unspecified: Secondary | ICD-10-CM

## 2019-05-30 NOTE — Therapy (Signed)
[facility] [address] [place], [place], [address] Phone: [phone]   Fax:  [phone]  Physical Therapy Evaluation  Patient Details  Name: Tanya Gregory MRN: [id number] Date of Birth: 1980-11-23 Referring Provider (PT): Dr. Myna Hidalgo   Encounter Date: 05/30/2019  PT End of Session - 05/30/19 1221    Visit Number  1    Date for PT Re-Evaluation  08/22/19    Authorization Type  Medicaid    PT Start Time  1145    PT Stop Time  1225    PT Time Calculation (min)  40 min    Activity Tolerance  Patient tolerated treatment well;No increased pain    Behavior During Therapy  WFL for tasks assessed/performed       Past Medical History:  Diagnosis Date  . History of pre-eclampsia   . HSV-2 (herpes simplex virus 2) infection   . Nephrolithiasis 2015  . Placenta previa   . Vaginal Pap smear, abnormal     Past Surgical History:  Procedure Laterality Date  . CESAREAN SECTION N/A 02/27/2019   Procedure: CESAREAN SECTION;  Surgeon: Myna Hidalgo, DO;  Location: [facility];  Service: Obstetrics;  Laterality: N/A;  . DIAGNOSTIC LAPAROSCOPY WITH REMOVAL OF ECTOPIC PREGNANCY  08/30/2016   Procedure: DIAGNOSTIC LAPAROSCOPYLEFT SALPINGECTOMY WITH REMOVAL OF ECTOPIC PREGNANCY;  Surgeon: Myna Hidalgo, DO;  Location: [facility];  Service: Gynecology;;  . LEEP  age 39    There were no vitals filed for this visit.   Subjective Assessment - 05/30/19 1150    Subjective  C-section 02/27/2019. Had sciatica pain with first child. Patient reports her sciatica pain started halfway through the pregnancy. Patient feels uncomfortable feeling into the right foot. Patient has tender to the touch in the right anterior tibialis. Patient feels a pop noise and rip feeling left lower abdominal and groin pain.    Patient Stated Goals  reduce pain, decrease pelvic popping    Currently in Pain?  Yes    Pain Score  4     Pain Location  Back    Pain Orientation  Lower;Right    Pain Descriptors / Indicators  Shooting    Pain Type  Acute pain    Pain Onset  More than a month ago    Pain Frequency  Intermittent    Aggravating Factors   when first get up from a sitting pain, walking    Pain Relieving Factors  stand and let it go down    Multiple Pain Sites  Yes    Pain Score  7    Pain Location  Groin    Pain Orientation  Left    Pain Descriptors / Indicators  Discomfort    Pain Type  Acute pain    Pain Radiating Towards  move the left leg    Pain Onset  More than a month ago    Pain Frequency  Intermittent    Aggravating Factors   when she feels a pop noise, rolling in bed    Pain Relieving Factors  lay still         [facility] PT Assessment - 05/30/19 0001      Assessment   Medical Diagnosis  M54.5 low bacak pain at multiple sites    Referring Provider (PT)  Dr. Myna Hidalgo    Onset Date/Surgical Date  11/23/18    Prior Therapy  none      Precautions   Precautions  None  Restrictions   Weight Bearing Restrictions  No      Balance Screen   Has the patient fallen in the past 6 months  No    Has the patient had a decrease in activity level because of a fear of falling?   No    Is the patient reluctant to leave their home because of a fear of falling?   No      Home Public house manager residence    Living Arrangements  Spouse/significant other;Children      Prior Function   Level of Independence  Independent    Leisure  none      Cognition   Overall Cognitive Status  Within Functional Limits for tasks assessed      Observation/Other Assessments   Skin Integrity  decreased mobility of c-section scar and tenderness      Posture/Postural Control   Posture/Postural Control  No significant limitations      ROM / Strength   AROM / PROM / Strength  AROM;PROM;Strength      AROM   Overall AROM Comments  lumbar ROM is full      Strength   Right Hip Flexion  3+/5    Right Hip Extension   4/5    Right Hip ABduction  3-/5    Right Hip ADduction  3+/5    Left Hip Flexion  3+/5    Left Hip Extension  4/5    Left Hip ABduction  3-/5    Left Hip ADduction  3+/5      Palpation   Spinal mobility  L5 rotated left    SI assessment   right ilium anteriorly rotated, sacrum rotated right      Special Tests    Special Tests  Sacrolliac Tests    Sacroiliac Tests   Pelvic Compression      Pelvic Compression   Findings  Positive    Side  Right    comment  pain      Gaenslen's test   Findings  Positive    Side   Right    Comments  pain                Objective measurements completed on examination: See above findings.    Pelvic Floor Special Questions - 05/30/19 0001    Prior Pregnancies  Yes    Number of Pregnancies  3    Number of C-Sections  1    Number of Vaginal Deliveries  2    Diastasis Recti  3 fingers width abouve umbilicus and 2 fingers width below umbilicus    Currently Sexually Active  Yes    Is this Painful  No    Urinary Leakage  No    Fecal incontinence  No    Falling out feeling (prolapse)  No    Pelvic Floor Internal Exam  Patient confirms identification and approves PT to assess the pelvic floor and treatment    Exam Type  Vaginal    Palpation  tenderness located on the bil. levator ani, and tightness on the left    Strength  weak squeeze, no lift   will raise chest instead of pelvic floor   Strength # of seconds  1                 PT Short Term Goals - 05/30/19 1233      PT SHORT TERM GOAL #1   Title  independent with initial HEP  Time  4    Period  Weeks    Status  New    Target Date  06/27/19        PT Long Term Goals - 05/30/19 1233      PT LONG TERM GOAL #1   Title  independent with advanced HEP    Baseline  not educated yet    Time  12    Period  Weeks    Status  New    Target Date  08/22/19      PT LONG TERM GOAL #2   Title  going from sit to stand with pain level </= 1/10 due to pelvis staying in  correct alignment    Baseline  right ilium is rotated anteriorly, pain level 7/10    Time  12    Period  Weeks    Status  New    Target Date  08/22/19      PT LONG TERM GOAL #3   Title  able to turn in bed without popping noise due to improved core strength and pain level </= 1/10    Baseline  popping noise, pain level 7/10    Time  12    Period  Weeks    Status  New    Target Date  08/22/19      PT LONG TERM GOAL #4   Title  walking for 30 minutes with her children with pain level </= 1/10 due to bilateral hip strength >/= 4/5    Baseline  bilateral hip strength 3/5, pain level 7/10    Time  12    Period  Weeks    Status  New    Target Date  08/22/19      PT LONG TERM GOAL #5   Title  pelvic floor strength >/= 3/5 so her pelvis is in correct alignment and reduce lumbar pain    Baseline  2/5 pelvic floor strength, right ilum is rotated anteriory    Time  12    Period  Weeks    Status  New    Target Date  08/22/19             Plan - 05/30/19 1225    Clinical Impression Statement  Patient is a 39 year old female with lumbar and pelvic pain. Lumbar pain is 4/10 with walking and going from sit to stand. Pelvic pain is 7/10 wiht bed mobility, lifting one leg, standing on one leg. Patient bilateral hip strength averages 3+/5. Diastasis recti is 3 fingers width above the umbilicus and 2 finger width below the umbilicus. Right ilium is rotated anteriorly and positive for compression and Gaelsens. Tenderness located suprapubically, bilateral levator ani, and right obturator internist. Decreased mobiltiy of the c-section scar. Patient will get a pop in the groin when moving around. Patient pelvic floor strength is 2/5 for 1 seconds and sometimes will subsitute with lifting her rib cage instead of contracting the pelvic floor. Abodminal strength is 2/5. Patient will benefit from skilled therapy to improve core strength, reduce her pain, improve quality of life.    Personal Factors and  Comorbidities  Comorbidity 1;Fitness    Comorbidities  c-section scar    Examination-Activity Limitations  Bed Mobility;Locomotion Level;Caring for Others;Stand;Stairs;Lift    Examination-Participation Restrictions  Community Activity;Cleaning    Stability/Clinical Decision Making  Evolving/Moderate complexity    Clinical Decision Making  Low    Rehab Potential  Excellent    PT Frequency  2x / week  initially can be 1 time per week for 3 weeks due to insurance   PT Duration  12 weeks    PT Treatment/Interventions  Biofeedback;Cryotherapy;Electrical Stimulation;Iontophoresis 4mg /ml Dexamethasone;Moist Heat;Ultrasound;Neuromuscular re-education;Therapeutic exercise;Therapeutic activities;Patient/family education;Manual techniques;Dry needling;Scar mobilization;Taping;Spinal Manipulations    PT Next Visit Plan  correct ilium, pulling the lumbar tissue to abdomen to work on diastasis recti, moving in bed with breath out and core stabilization, self perineal massage, c-section massage    Consulted and Agree with Plan of Care  Patient       Patient will benefit from skilled therapeutic intervention in order to improve the following deficits and impairments:  Decreased coordination, Decreased range of motion, Increased fascial restricitons, Difficulty walking, Increased muscle spasms, Decreased activity tolerance, Pain, Decreased scar mobility, Decreased mobility, Decreased strength  Visit Diagnosis: Chronic low back pain, unspecified back pain laterality, unspecified whether sciatica present - Plan: PT plan of care cert/re-cert  Muscle weakness (generalized) - Plan: PT plan of care cert/re-cert  Back pain, sacroiliac - Plan: PT plan of care cert/re-cert     Problem List Patient Active Problem List   Diagnosis Date Noted  . Placenta previa affecting delivery 02/27/2019  . Placenta previa 01/16/2019  . Routine screening for STI (sexually transmitted infection) 05/24/2018  . Vaginal  discharge 05/24/2018  . Exposure to chlamydia 05/24/2018  . Bacterial vaginosis 05/24/2018  . Normal intrauterine pregnancy in third trimester 08/02/2017    10/02/2017, PT 05/30/19 12:45 PM   [facility] [address] [place], [place], [place] Phone: [phone]   Fax:  [phone]  Name: Tanya Gregory MRN: Raul Del Date of Birth: 31-Oct-1980

## 2019-06-05 ENCOUNTER — Other Ambulatory Visit: Payer: Self-pay

## 2019-06-05 ENCOUNTER — Encounter: Payer: Self-pay | Admitting: Physical Therapy

## 2019-06-05 ENCOUNTER — Ambulatory Visit: Payer: Medicaid Other | Admitting: Physical Therapy

## 2019-06-05 DIAGNOSIS — M6281 Muscle weakness (generalized): Secondary | ICD-10-CM

## 2019-06-05 DIAGNOSIS — G8929 Other chronic pain: Secondary | ICD-10-CM

## 2019-06-05 DIAGNOSIS — M533 Sacrococcygeal disorders, not elsewhere classified: Secondary | ICD-10-CM

## 2019-06-05 DIAGNOSIS — M545 Low back pain, unspecified: Secondary | ICD-10-CM

## 2019-06-05 NOTE — Therapy (Signed)
[facility] [address], [address] [place], [place], [address] Phone: [phone]   Fax:  [phone]  Physical Therapy Treatment  Patient Details  Name: Tanya Gregory MRN: [id number] Date of Birth: 06-02-1980 Referring Provider (PT): Dr. Myna Hidalgo   Encounter Date: 06/05/2019  PT End of Session - 06/05/19 1319    Visit Number  2    Date for PT Re-Evaluation  08/22/19    Authorization Type  Medicaid    Authorization Time Period  06/05/2019-06/25/2019    Authorization - Visit Number  1    Authorization - Number of Visits  3    PT Start Time  1230    PT Stop Time  1310    PT Time Calculation (min)  40 min    Activity Tolerance  Patient tolerated treatment well;No increased pain    Behavior During Therapy  WFL for tasks assessed/performed       Past Medical History:  Diagnosis Date  . History of pre-eclampsia   . HSV-2 (herpes simplex virus 2) infection   . Nephrolithiasis 2015  . Placenta previa   . Vaginal Pap smear, abnormal     Past Surgical History:  Procedure Laterality Date  . CESAREAN SECTION N/A 02/27/2019   Procedure: CESAREAN SECTION;  Surgeon: Myna Hidalgo, DO;  Location: [facility];  Service: Obstetrics;  Laterality: N/A;  . DIAGNOSTIC LAPAROSCOPY WITH REMOVAL OF ECTOPIC PREGNANCY  08/30/2016   Procedure: DIAGNOSTIC LAPAROSCOPYLEFT SALPINGECTOMY WITH REMOVAL OF ECTOPIC PREGNANCY;  Surgeon: Myna Hidalgo, DO;  Location: [facility];  Service: Gynecology;;  . LEEP  age 46    There were no vitals filed for this visit.  Subjective Assessment - 06/05/19 1234    Subjective  I have alot of pain in my right low back.    Patient Stated Goals  reduce pain, decrease pelvic popping    Currently in Pain?  Yes    Pain Score  3     Pain Location  Back    Pain Orientation  Lower;Right    Pain Descriptors / Indicators  Shooting    Pain Type  Acute pain    Pain Onset  More than a month ago    Pain Frequency   Intermittent    Aggravating Factors   when first get up from a sitting pain, walking    Pain Relieving Factors  stand and let it go down    Multiple Pain Sites  Yes    Pain Score  7    Pain Location  Groin    Pain Orientation  Left    Pain Descriptors / Indicators  Discomfort    Pain Type  Acute pain    Pain Radiating Towards  move the left leg    Pain Onset  More than a month ago    Pain Frequency  Intermittent    Aggravating Factors   when she feels a posp noise, rolling in bed    Pain Relieving Factors  lay still                       [facility] Adult PT Treatment/Exercise - 06/05/19 0001      Lumbar Exercises: Stretches   Lower Trunk Rotation  2 reps;30 seconds    Lower Trunk Rotation Limitations  supine each side    Piriformis Stretch  Right;Left;1 rep;30 seconds    Piriformis Stretch Limitations  supine      Lumbar Exercises: Supine  Ab Set  10 reps;5 seconds    AB Set Limitations  with therapist guiding the lwoer rib cage downward    Clam  10 reps;1 second    Clam Limitations  10 times each side with monitoring SI painand contracting the deep abdominal muscles      Lumbar Exercises: Quadruped   Madcat/Old Horse  10 reps    Madcat/Old Horse Limitations  tactile cues to tuck her tailbone down      Manual Therapy   Manual Therapy  Soft tissue mobilization;Joint mobilization;Myofascial release    Joint Mobilization  P-A and rotational mobilization to T5-L5 in prone and left sidely    Soft tissue mobilization  using assistive device to elongate the thoracic and lumbar paraspinals; right iliiopsoas in supine with patient doing abdominal breathing    Myofascial Release  quadruped release the back tissue forward to the abdomen so she is able to pull the abdominal muscles forward; release of the right lower abdominal and obliques       Trigger Point Dry Needling - 06/05/19 0001    Consent Given?  Yes    Education Handout Provided  Yes    Muscles Treated Back/Hip   Lumbar multifidi    Lumbar multifidi Response  Twitch response elicited;Palpable increased muscle length           PT Education - 06/05/19 1312    Education Details  Access Code: [email or web address]; information on dry needling    Person(s) Educated  Patient    Methods  Explanation;Demonstration;Verbal cues;Handout    Comprehension  Returned demonstration;Verbalized understanding       PT Short Term Goals - 05/30/19 1233      PT SHORT TERM GOAL #1   Title  independent with initial HEP    Time  4    Period  Weeks    Status  New    Target Date  06/27/19        PT Long Term Goals - 05/30/19 1233      PT LONG TERM GOAL #1   Title  independent with advanced HEP    Baseline  not educated yet    Time  12    Period  Weeks    Status  New    Target Date  08/22/19      PT LONG TERM GOAL #2   Title  going from sit to stand with pain level </= 1/10 due to pelvis staying in correct alignment    Baseline  right ilium is rotated anteriorly, pain level 7/10    Time  12    Period  Weeks    Status  New    Target Date  08/22/19      PT LONG TERM GOAL #3   Title  able to turn in bed without popping noise due to improved core strength and pain level </= 1/10    Baseline  popping noise, pain level 7/10    Time  12    Period  Weeks    Status  New    Target Date  08/22/19      PT LONG TERM GOAL #4   Title  walking for 30 minutes with her children with pain level </= 1/10 due to bilateral hip strength >/= 4/5    Baseline  bilateral hip strength 3/5, pain level 7/10    Time  12    Period  Weeks    Status  New    Target Date  08/22/19  PT LONG TERM GOAL #5   Title  pelvic floor strength >/= 3/5 so her pelvis is in correct alignment and reduce lumbar pain    Baseline  2/5 pelvic floor strength, right ilum is rotated anteriory    Time  12    Period  Weeks    Status  New    Target Date  08/22/19            Plan - 06/05/19 1320    Clinical Impression Statement  Patient is  doing her c-section scar massage. Patient was able to contract her lower abdominals better since some trigger points were released in the abdominal tissue. Patient had less tightness in the lumbar region and able to move the vertbrae easier. Patient had increased tigthness in the right obliques. Patient had reduction of the diastasis recti with 1 finger width below umbilicus and 2 finger just above the umbilicus. Patient will benefit from skilled therapy to improve core strength, reduce her pain and improve quality of life.    Personal Factors and Comorbidities  Comorbidity 1;Fitness    Comorbidities  c-section scar    Examination-Activity Limitations  Bed Mobility;Locomotion Level;Caring for Others;Stand;Stairs;Lift    Examination-Participation Restrictions  Community Activity;Cleaning    Stability/Clinical Decision Making  Evolving/Moderate complexity    Rehab Potential  Excellent    PT Frequency  2x / week   can be 1 time per week due to insurance   PT Duration  12 weeks    PT Treatment/Interventions  Biofeedback;Cryotherapy;Electrical Stimulation;Iontophoresis 4mg /ml Dexamethasone;Moist Heat;Ultrasound;Neuromuscular re-education;Therapeutic exercise;Therapeutic activities;Patient/family education;Manual techniques;Dry needling;Scar mobilization;Taping;Spinal Manipulations    PT Next Visit Plan  progress core with hip flexion, abdominal brace with arm, bridge, clam, quadruped abdominal contraction; check on alignment    PT Home Exercise Plan  Access Code:    Consulted and Agree with Plan of Care  Patient       Patient will benefit from skilled therapeutic intervention in order to improve the following deficits and impairments:  Decreased coordination, Decreased range of motion, Increased fascial restricitons, Difficulty walking, Increased muscle spasms, Decreased activity tolerance, Pain, Decreased scar mobility, Decreased mobility, Decreased strength  Visit Diagnosis: Chronic low  back pain, unspecified back pain laterality, unspecified whether sciatica present  Muscle weakness (generalized)  Back pain, sacroiliac     Problem List Patient Active Problem List   Diagnosis Date Noted  . Placenta previa affecting delivery 02/27/2019  . Placenta previa 01/16/2019  . Routine screening for STI (sexually transmitted infection) 05/24/2018  . Vaginal discharge 05/24/2018  . Exposure to chlamydia 05/24/2018  . Bacterial vaginosis 05/24/2018  . Normal intrauterine pregnancy in third trimester 08/02/2017    10/02/2017, PT 06/05/19 1:25 PM   [facility] [address], [address] [place], [place], [place] Phone: [phone]   Fax:  [phone]  Name: Tanya Gregory MRN: Raul Del Date of Birth: September 14, 1980

## 2019-06-05 NOTE — Patient Instructions (Addendum)
Trigger Point Dry Needling  . What is Trigger Point Dry Needling (DN)? o DN is a physical therapy technique used to treat muscle pain and dysfunction. Specifically, DN helps deactivate muscle trigger points (muscle knots).  o A thin filiform needle is used to penetrate the skin and stimulate the underlying trigger point. The goal is for a local twitch response (LTR) to occur and for the trigger point to relax. No medication of any kind is injected during the procedure.   . What Does Trigger Point Dry Needling Feel Like?  o The procedure feels different for each individual patient. Some patients report that they do not actually feel the needle enter the skin and overall the process is not painful. Very mild bleeding may occur. However, many patients feel a deep cramping in the muscle in which the needle was inserted. This is the local twitch response.   Marland Kitchen How Will I feel after the treatment? o Soreness is normal, and the onset of soreness may not occur for a few hours. Typically this soreness does not last longer than two days.  o Bruising is uncommon, however; ice can be used to decrease any possible bruising.  o In rare cases feeling tired or nauseous after the treatment is normal. In addition, your symptoms may get worse before they get better, this period will typically not last longer than 24 hours.   . What Can I do After My Treatment? o Increase your hydration by drinking more water for the next 24 hours. o You may place ice or heat on the areas treated that have become sore, however, do not use heat on inflamed or bruised areas. Heat often brings more relief post needling. o You can continue your regular activities, but vigorous activity is not recommended initially after the treatment for 24 hours. o DN is best combined with other physical therapy such as strengthening, stretching, and other therapies.    [facility] [address] [place], [place]  [address] Phone # [phone] Fax [phone]  Access Code: [email or web address]  URL: [email or web address]  Date: 06/05/2019  Prepared by: Eulis Foster   Exercises Supine Double Knee to Chest - 1 reps - 1 sets - 30 sec hold - 1x daily - 7x weekly Supine Lower Trunk Rotation - 1 reps - 1 sets - 30 sec hold - 1x daily - 7x weekly Cat-Camel - 10 reps - 1 sets - 1x daily - 7x weekly Child's Pose Stretch - 1 reps - 1 sets - 30 sec hold - 1x daily - 7x weekly Supine Piriformis Stretch Pulling Heel to Hip - 1 reps - 1 sets - 30 sec hold - 1x daily - 7x weekly Hooklying Transversus Abdominis Palpation - 10 reps - 1 sets - 5 sec hold - 1x daily - 7x weekly Bent Knee Fallouts - 10 reps - 1 sets - 1x daily - 7x weekly

## 2019-06-14 ENCOUNTER — Encounter: Payer: Self-pay | Admitting: Physical Therapy

## 2019-06-14 ENCOUNTER — Ambulatory Visit: Payer: Medicaid Other | Admitting: Physical Therapy

## 2019-06-14 ENCOUNTER — Other Ambulatory Visit: Payer: Self-pay

## 2019-06-14 DIAGNOSIS — M545 Low back pain, unspecified: Secondary | ICD-10-CM

## 2019-06-14 DIAGNOSIS — M6281 Muscle weakness (generalized): Secondary | ICD-10-CM

## 2019-06-14 DIAGNOSIS — M533 Sacrococcygeal disorders, not elsewhere classified: Secondary | ICD-10-CM

## 2019-06-14 DIAGNOSIS — G8929 Other chronic pain: Secondary | ICD-10-CM

## 2019-06-14 NOTE — Patient Instructions (Signed)
Access Code: [email or web address]  URL: [email or web address]  Date: 06/14/2019  Prepared by: Eulis Foster   Exercises Supine Double Knee to Chest - 1 reps - 1 sets - 30 sec hold - 1x daily - 7x weekly Supine Lower Trunk Rotation - 1 reps - 1 sets - 30 sec hold - 1x daily - 7x weekly Cat-Camel - 10 reps - 1 sets - 1x daily - 7x weekly Child's Pose Stretch - 1 reps - 1 sets - 30 sec hold - 1x daily - 7x weekly Supine Piriformis Stretch Pulling Heel to Hip - 1 reps - 1 sets - 30 sec hold - 1x daily - 7x weekly Hooklying Transversus Abdominis Palpation - 10 reps - 1 sets - 5 sec hold - 1x daily - 7x weekly Bent Knee Fallouts - 10 reps - 1 sets - 1x daily - 7x weekly Hooklying Isometric Hip Flexion - 10 reps - 1 sets - 5 sec hold - 1x daily - 7x weekly Supine Bridge with Pelvic Floor Contraction - 10 reps - 1 sets - 3 sec hold - 1x daily - 7x weekly [facility] [address] [place], [place] [address] Phone # [phone] Fax [phone]

## 2019-06-14 NOTE — Therapy (Signed)
[facility] [address] [place], [place], [address] Phone: [phone]   Fax:  [phone]  Physical Therapy Treatment  Patient Details  Name: Tanya Gregory MRN: [id number] Date of Birth: 28-Jun-1980 Referring Provider (PT): Dr. Myna Hidalgo   Encounter Date: 06/14/2019  PT End of Session - 06/14/19 1452    Visit Number  3    Date for PT Re-Evaluation  08/22/19    Authorization Type  Medicaid    Authorization Time Period  06/05/2019-06/25/2019    Authorization - Visit Number  2    Authorization - Number of Visits  3    PT Start Time  1445    PT Stop Time  1525    PT Time Calculation (min)  40 min    Activity Tolerance  Patient tolerated treatment well;No increased pain       Past Medical History:  Diagnosis Date  . History of pre-eclampsia   . HSV-2 (herpes simplex virus 2) infection   . Nephrolithiasis 2015  . Placenta previa   . Vaginal Pap smear, abnormal     Past Surgical History:  Procedure Laterality Date  . CESAREAN SECTION N/A 02/27/2019   Procedure: CESAREAN SECTION;  Surgeon: Myna Hidalgo, DO;  Location: [facility];  Service: Obstetrics;  Laterality: N/A;  . DIAGNOSTIC LAPAROSCOPY WITH REMOVAL OF ECTOPIC PREGNANCY  08/30/2016   Procedure: DIAGNOSTIC LAPAROSCOPYLEFT SALPINGECTOMY WITH REMOVAL OF ECTOPIC PREGNANCY;  Surgeon: Myna Hidalgo, DO;  Location: [facility];  Service: Gynecology;;  . LEEP  age 3    There were no vitals filed for this visit.  Subjective Assessment - 06/14/19 1447    Subjective  My pelvic is hurting at 4-5/10 level when it pops. I put the pillow between knees help. When I sit and get up I get up I have a sharp pain down the right leg. the dry needlind did help and I was able to feel the muscle of the back.    Patient Stated Goals  reduce pain, decrease pelvic popping    Currently in Pain?  Yes    Pain Score  7     Pain Location  Back    Pain Orientation  Right;Lower    Pain Descriptors / Indicators  Shooting    Pain Type  Acute pain    Pain Onset  More than a month ago    Pain Frequency  Intermittent    Aggravating Factors   when first get up from a sitting pain    Pain Relieving Factors  stand and let it go down    Multiple Pain Sites  Yes    Pain Score  4    Pain Location  Groin    Pain Orientation  Left    Pain Descriptors / Indicators  Throbbing    Pain Type  Acute pain    Pain Onset  More than a month ago    Pain Frequency  Intermittent    Aggravating Factors   when she feels a pop noise, rolling in bed    Pain Relieving Factors  lay still         [facility] PT Assessment - 06/14/19 0001      Palpation   SI assessment   right ilium anteriorly rotated, sacrum rotated right                   [facility] Adult PT Treatment/Exercise - 06/14/19 0001      Lumbar Exercises: Supine  Ab Set  10 reps;5 seconds    AB Set Limitations  with therapist guiding the lwoer rib cage downward    Clam  10 reps;1 second    Clam Limitations  10 times each side with monitoring SI painand contracting the deep abdominal muscles    Bridge  10 reps;3 seconds    Bridge Limitations  with pelvic floor contraction    Isometric Hip Flexion  10 reps;5 seconds    Isometric Hip Flexion Limitations  each leg with pelvic floor contraction      Manual Therapy   Manual Therapy  Joint mobilization;Muscle Energy Technique    Joint Mobilization  P-A and rotational mobilization to T5-L5 in prone, correct rotated right sacrum  Sidely gapping of the right T10-L5     Muscle Energy Technique  correct right ilium             PT Education - 06/14/19 1527    Education Details  Access Code: [email or web address]    Person(s) Educated  Patient    Methods  Explanation;Demonstration;Verbal cues;Handout    Comprehension  Returned demonstration;Verbalized understanding       PT Short Term Goals - 06/14/19 1531      PT SHORT TERM GOAL #1   Title  independent with initial HEP     Time  4    Period  Weeks    Status  Achieved        PT Long Term Goals - 06/14/19 1503      PT LONG TERM GOAL #1   Title  independent with advanced HEP    Baseline  still learning    Time  12    Period  Weeks    Status  On-going      PT LONG TERM GOAL #2   Title  going from sit to stand with pain level </= 1/10 due to pelvis staying in correct alignment    Baseline  right ilium is rotated anteriorly, pain level 7/10    Time  12    Period  Weeks    Status  On-going      PT LONG TERM GOAL #3   Title  able to turn in bed without popping noise due to improved core strength and pain level </= 1/10    Baseline  popping noise, pain level 7/10    Time  12    Period  Weeks    Status  On-going      PT LONG TERM GOAL #4   Title  walking for 30 minutes with her children with pain level </= 1/10 due to bilateral hip strength >/= 4/5    Baseline  bilateral hip strength 3/5, pain level 5/10    Time  12    Period  Weeks    Status  On-going            Plan - 06/14/19 1454    Clinical Impression Statement  Patient pain has decreased since starting therapy. Patient pelvis is able to be corrected with manual work. Patient needs tactile cues to engage her lower abdominals and was able to correctly after therapy. Patient has tightness in the lumbar vertebrae. Patient has pain in the right lumbar that is referred into the right buttocks. Patient has 1 more visit left. Patient will benefit from skilled therapy to improve core strength , reduce her pain and improve quality of life.    Personal Factors and Comorbidities  Comorbidity 1;Fitness    Comorbidities  c-section scar  Examination-Activity Limitations  Bed Mobility;Locomotion Level;Caring for Others;Stand;Stairs;Lift    Examination-Participation Restrictions  Community Activity;Cleaning    Stability/Clinical Decision Making  Evolving/Moderate complexity    Rehab Potential  Excellent    PT Frequency  2x / week   may be 1 time per  week for insurance   PT Duration  12 weeks    PT Treatment/Interventions  Biofeedback;Cryotherapy;Electrical Stimulation;Iontophoresis 4mg /ml Dexamethasone;Moist Heat;Ultrasound;Neuromuscular re-education;Therapeutic exercise;Therapeutic activities;Patient/family education;Manual techniques;Dry needling;Scar mobilization;Taping;Spinal Manipulations    PT Next Visit Plan  abdominal brace with arm, quadruped abdominal contraction; check on alignment; discharge; see how pain in lumbar to right buttocks is    PT Home Exercise Plan  Access Code:    Consulted and Agree with Plan of Care  Patient       Patient will benefit from skilled therapeutic intervention in order to improve the following deficits and impairments:  Decreased coordination, Decreased range of motion, Increased fascial restricitons, Difficulty walking, Increased muscle spasms, Decreased activity tolerance, Pain, Decreased scar mobility, Decreased mobility, Decreased strength  Visit Diagnosis: Chronic low back pain, unspecified back pain laterality, unspecified whether sciatica present  Muscle weakness (generalized)  Back pain, sacroiliac     Problem List Patient Active Problem List   Diagnosis Date Noted  . Placenta previa affecting delivery 02/27/2019  . Placenta previa 01/16/2019  . Routine screening for STI (sexually transmitted infection) 05/24/2018  . Vaginal discharge 05/24/2018  . Exposure to chlamydia 05/24/2018  . Bacterial vaginosis 05/24/2018  . Normal intrauterine pregnancy in third trimester 08/02/2017    10/02/2017, PT 06/14/19 3:32 PM   [facility] [address], [address] [place], [place], [place] Phone: [phone]   Fax:  [phone]  Name: Tanya Gregory MRN: Raul Del Date of Birth: 12-Apr-1981

## 2019-06-20 ENCOUNTER — Other Ambulatory Visit: Payer: Self-pay

## 2019-06-20 ENCOUNTER — Encounter: Payer: Self-pay | Admitting: Physical Therapy

## 2019-06-20 ENCOUNTER — Ambulatory Visit: Payer: Medicaid Other | Admitting: Physical Therapy

## 2019-06-20 DIAGNOSIS — G8929 Other chronic pain: Secondary | ICD-10-CM

## 2019-06-20 DIAGNOSIS — M533 Sacrococcygeal disorders, not elsewhere classified: Secondary | ICD-10-CM

## 2019-06-20 DIAGNOSIS — M6281 Muscle weakness (generalized): Secondary | ICD-10-CM

## 2019-06-20 DIAGNOSIS — M545 Low back pain, unspecified: Secondary | ICD-10-CM

## 2019-06-20 NOTE — Therapy (Signed)
[facility] [address] [place], [place], [address] Phone: [phone]   Fax:  [phone]  Physical Therapy Treatment  Patient Details  Name: Tanya Gregory MRN: [id number] Date of Birth: 1980-08-13 Referring Provider (PT): Dr. Janyth Pupa   Encounter Date: 06/20/2019  PT End of Session - 06/20/19 1237    Visit Number  4    Date for PT Re-Evaluation  08/22/19    Authorization Type  Medicaid    Authorization Time Period  06/05/2019-06/25/2019    Authorization - Visit Number  3    Authorization - Number of Visits  3    PT Start Time  6789    PT Stop Time  1310    PT Time Calculation (min)  40 min    Activity Tolerance  Patient tolerated treatment well;No increased pain    Behavior During Therapy  WFL for tasks assessed/performed       Past Medical History:  Diagnosis Date  . History of pre-eclampsia   . HSV-2 (herpes simplex virus 2) infection   . Nephrolithiasis 2015  . Placenta previa   . Vaginal Pap smear, abnormal     Past Surgical History:  Procedure Laterality Date  . CESAREAN SECTION N/A 02/27/2019   Procedure: CESAREAN SECTION;  Surgeon: Janyth Pupa, DO;  Location: [place] [facility];  Service: Obstetrics;  Laterality: N/A;  . DIAGNOSTIC LAPAROSCOPY WITH REMOVAL OF ECTOPIC PREGNANCY  08/30/2016   Procedure: DIAGNOSTIC LAPAROSCOPYLEFT SALPINGECTOMY WITH REMOVAL OF ECTOPIC PREGNANCY;  Surgeon: Janyth Pupa, DO;  Location: [place] [facility];  Service: Gynecology;;  . LEEP  age 6    There were no vitals filed for this visit.  Subjective Assessment - 06/20/19 1233    Subjective  When I walk my pelvic pops on the left. It may pop 5-10 times and stop. The left side of incision has been bothering me. Getting in bed has been painful this week. I feel stuck and not able to move.    Patient Stated Goals  reduce pain, decrease pelvic popping    Currently in Pain?  Yes    Pain Score  7     Pain Location  Back    Pain  Orientation  Right;Lower    Pain Descriptors / Indicators  Shooting    Pain Type  Acute pain    Pain Onset  More than a month ago    Pain Frequency  Intermittent    Aggravating Factors   when first getting up from a sitting position    Pain Relieving Factors  stand and let it go down    Pain Score  8    Pain Location  Groin    Pain Orientation  Left    Pain Descriptors / Indicators  Throbbing    Pain Type  Acute pain    Pain Radiating Towards  move the left leg    Pain Onset  More than a month ago    Pain Frequency  Intermittent    Aggravating Factors   when she feels a pop noise, rolling in bed    Pain Relieving Factors  lay still         [facility] PT Assessment - 06/20/19 0001      Assessment   Medical Diagnosis  M54.5 low bacak pain at multiple sites    Referring Provider (PT)  Dr. Janyth Pupa    Onset Date/Surgical Date  11/23/18    Prior Therapy  none  Precautions   Precautions  None      Restrictions   Weight Bearing Restrictions  No      Home Environment   Living Environment  Private residence    Living Arrangements  Spouse/significant other;Children      Prior Function   Level of Independence  Independent      Cognition   Overall Cognitive Status  Within Functional Limits for tasks assessed      Posture/Postural Control   Posture/Postural Control  No significant limitations      ROM / Strength   AROM / PROM / Strength  AROM;PROM;Strength      Strength   Right Hip Flexion  3+/5    Right Hip Extension  4/5    Right Hip ABduction  3+/5    Right Hip ADduction  3+/5    Left Hip Flexion  3+/5    Left Hip Extension  4/5    Left Hip ABduction  3-/5    Left Hip ADduction  3+/5      Palpation   Spinal mobility  decreased mobility of L3-L5 and T5-T10    SI assessment   right ilium anteriorly rotated, sacrum rotated right    Palpation comment  tenderness located on the c-section scar      Special Tests   Sacroiliac Tests   Pelvic Compression       Pelvic Compression   Findings  Positive    Side  Right    comment  pain                Pelvic Floor Special Questions - 06/20/19 0001    Prior Pregnancies  Yes    Number of Pregnancies  3    Number of C-Sections  1    Number of Vaginal Deliveries  2    Diastasis Recti  3 fingers width abouve umbilicus and 2 fingers width below umbilicus    Currently Sexually Active  Yes    Is this Painful  No    Urinary Leakage  No    Fecal incontinence  No    Falling out feeling (prolapse)  No    Palpation  tenderness located on the bil. levator ani, and tightness on the left    Strength  weak squeeze, no lift   will raise chest instead of pelvic floor       [facility] Adult PT Treatment/Exercise - 06/20/19 0001      Lumbar Exercises: Supine   Ab Set  10 reps;5 seconds    AB Set Limitations  with tactile cues to contract the lower abdomen, patient has difficulty with feeling if her muscles are contracting    Other Supine Lumbar Exercises  pelvic tilt to improve flattening of the lumbar spine    Other Supine Lumbar Exercises  supine with knees bend and press into ball with one hand and contract the lower abdominals but very difficulty      Manual Therapy   Manual Therapy  Joint mobilization;Taping    Joint Mobilization  P-A and rotational mobilization to T5-L5 in prone, correct rotated right sacrum      Muscle Energy Technique  correct right ilium and left ilum    McConnell  across the sacral area to improve stabilization to the joint and reduce the movement    Kinesiotex  Facilitate Muscle      Kinesiotix   Facilitate Muscle   lower abdominals             PT  Education - 06/20/19 1316    Education Details  Access Code: [email or web address]; can take her tape off 3-5 days and shower with it    Person(s) Educated  Patient    Methods  Explanation;Demonstration;Verbal cues;Handout    Comprehension  Verbalized understanding;Returned demonstration       PT Short Term Goals - 06/14/19 1531       PT SHORT TERM GOAL #1   Title  independent with initial HEP    Time  4    Period  Weeks    Status  Achieved        PT Long Term Goals - 06/20/19 1325      PT LONG TERM GOAL #1   Title  independent with advanced HEP    Baseline  still learning    Time  12    Period  Weeks    Status  On-going      PT LONG TERM GOAL #2   Title  going from sit to stand with pain level </= 1/10 due to pelvis staying in correct alignment    Baseline  right ilium is rotated anteriorly, pain level 7/10    Time  12    Period  Weeks    Status  On-going      PT LONG TERM GOAL #3   Title  able to turn in bed without popping noise due to improved core strength and pain level </= 1/10    Baseline  popping noise, pain level 7/10    Time  12    Period  Weeks    Status  On-going      PT LONG TERM GOAL #4   Title  walking for 30 minutes with her children with pain level </= 1/10 due to bilateral hip strength >/= 4/5    Baseline  bilateral hip strength 3/5, pain level 5/10    Time  12    Period  Weeks    Status  On-going      PT LONG TERM GOAL #5   Title  pelvic floor strength >/= 3/5 so her pelvis is in correct alignment and reduce lumbar pain    Baseline  2/5 pelvic floor strength, right ilum is rotated anteriory    Time  12    Period  Weeks    Status  On-going            Plan - 06/20/19 1238    Clinical Impression Statement  Patient pelvis not in correct alignment this week and she is having popping in the left SI joint making it difficult to walk and move in bed. Patient has tightness in the lumbar making it difficult for her to flatten her spine and engage the abdominals. Patient needs many tactile cues to contract the lower abdominals due to decreased sensation from her C-section. Patient continues to have 3 fingers width apart diastasis recti. Patient needs increased SI stability with strength and stability in the SI joint. Patient will benefit from skilled therapy to improve core  strength, reduce her pain and improve quality of life.    Personal Factors and Comorbidities  Comorbidity 1;Fitness    Comorbidities  c-section scar    Examination-Activity Limitations  Bed Mobility;Locomotion Level;Caring for Others;Stand;Stairs;Lift    Examination-Participation Restrictions  Community Activity;Cleaning    Stability/Clinical Decision Making  Evolving/Moderate complexity    Rehab Potential  Excellent    PT Frequency  2x / week    PT Duration  12 weeks    PT  Treatment/Interventions  Biofeedback;Cryotherapy;Electrical Stimulation;Iontophoresis 4mg /ml Dexamethasone;Moist Heat;Ultrasound;Neuromuscular re-education;Therapeutic exercise;Therapeutic activities;Patient/family education;Manual techniques;Dry needling;Scar mobilization;Taping;Spinal Manipulations    PT Next Visit Plan  see if the tape has helped, work on lower abdominal contraction, work on right buttock pain,    PT Home Exercise Plan  Access Code:    Consulted and Agree with Plan of Care  Patient       Patient will benefit from skilled therapeutic intervention in order to improve the following deficits and impairments:  Decreased coordination, Decreased range of motion, Increased fascial restricitons, Difficulty walking, Increased muscle spasms, Decreased activity tolerance, Pain, Decreased scar mobility, Decreased mobility, Decreased strength  Visit Diagnosis: Chronic low back pain, unspecified back pain laterality, unspecified whether sciatica present  Muscle weakness (generalized)  Back pain, sacroiliac     Problem List Patient Active Problem List   Diagnosis Date Noted  . Placenta previa affecting delivery 02/27/2019  . Placenta previa 01/16/2019  . Routine screening for STI (sexually transmitted infection) 05/24/2018  . Vaginal discharge 05/24/2018  . Exposure to chlamydia 05/24/2018  . Bacterial vaginosis 05/24/2018  . Normal intrauterine pregnancy in third trimester 08/02/2017    10/02/2017, PT 06/20/19 1:31 PM   [facility] [address] [place], [place], [place] Phone: [phone]   Fax:  [phone]  Name: Tanya Gregory MRN: Raul Del Date of Birth: June 14, 1980

## 2019-06-20 NOTE — Patient Instructions (Signed)
Access Code: [email or web address]  URL: [email or web address]  Date: 06/20/2019  Prepared by: Eulis Foster   Exercises Supine Double Knee to Chest - 1 reps - 1 sets - 30 sec hold - 1x daily - 7x weekly Supine Lower Trunk Rotation - 1 reps - 1 sets - 30 sec hold - 1x daily - 7x weekly Cat-Camel - 10 reps - 1 sets - 1x daily - 7x weekly Child's Pose Stretch - 1 reps - 1 sets - 30 sec hold - 1x daily - 7x weekly Supine Piriformis Stretch Pulling Heel to Hip - 1 reps - 1 sets - 30 sec hold - 1x daily - 7x weekly Hooklying Transversus Abdominis Palpation - 10 reps - 1 sets - 5 sec hold - 1x daily - 7x weekly Bent Knee Fallouts - 10 reps - 1 sets - 1x daily - 7x weekly Hooklying Isometric Hip Flexion - 10 reps - 1 sets - 5 sec hold - 1x daily - 7x weekly Supine Bridge with Pelvic Floor Contraction - 10 reps - 1 sets - 3 sec hold - 1x daily - 7x weekly [facility] [address] [place], [place] [address] Phone # [phone] Fax [phone]

## 2019-06-27 ENCOUNTER — Other Ambulatory Visit: Payer: Self-pay

## 2019-06-27 ENCOUNTER — Ambulatory Visit: Payer: Medicaid Other | Attending: Obstetrics & Gynecology | Admitting: Physical Therapy

## 2019-06-27 ENCOUNTER — Encounter: Payer: Self-pay | Admitting: Physical Therapy

## 2019-06-27 DIAGNOSIS — M6281 Muscle weakness (generalized): Secondary | ICD-10-CM | POA: Insufficient documentation

## 2019-06-27 DIAGNOSIS — G8929 Other chronic pain: Secondary | ICD-10-CM | POA: Diagnosis present

## 2019-06-27 DIAGNOSIS — M545 Low back pain, unspecified: Secondary | ICD-10-CM

## 2019-06-27 DIAGNOSIS — M533 Sacrococcygeal disorders, not elsewhere classified: Secondary | ICD-10-CM | POA: Diagnosis present

## 2019-06-27 NOTE — Patient Instructions (Signed)
Access Code: [email or web address]  URL: [email or web address]  Date: 06/27/2019  Prepared by: Eulis Foster   Exercises Supine Double Knee to Chest - 1 reps - 1 sets - 30 sec hold - 1x daily - 7x weekly Supine Lower Trunk Rotation - 1 reps - 1 sets - 30 sec hold - 1x daily - 7x weekly Cat-Camel - 10 reps - 1 sets - 1x daily - 7x weekly Child's Pose Stretch - 1 reps - 1 sets - 30 sec hold - 1x daily - 7x weekly Supine Piriformis Stretch Pulling Heel to Hip - 1 reps - 1 sets - 30 sec hold - 1x daily - 7x weekly Hooklying Transversus Abdominis Palpation - 10 reps - 1 sets - 5 sec hold - 1x daily - 7x weekly Bent Knee Fallouts - 10 reps - 1 sets - 1x daily - 7x weekly Hooklying Isometric Hip Flexion - 10 reps - 1 sets - 5 sec hold - 1x daily - 7x weekly Supine Bridge with Pelvic Floor Contraction - 10 reps - 1 sets - 3 sec hold - 1x daily - 7x weekly Supine Alternating Shoulder Flexion - 10 reps - 1 sets - 1x daily - 7x weekly Supine Pelvic Floor Contract and Release - 10 reps - 1 sets - 5 sec hold - 2x daily - 7x weekly [facility] [address] [place], [place] [address] Phone # [phone] Fax [phone]

## 2019-06-27 NOTE — Therapy (Addendum)
[facility] [address], [place], [place], [address] Phone: [phone]   Fax:  [phone]  Physical Therapy Treatment  Patient Details  Name: Tanya Gregory MRN: [id number] Date of Birth: [birth year] Referring Provider (PT): Dr. Janyth Pupa   Encounter Date: 06/27/2019  PT End of Session - 06/27/19 1457    Visit Number  5    Date for PT Re-Evaluation  08/22/19    Authorization Type  Medicaid    Authorization Time Period  06/27/19-08/07/2019    Authorization - Visit Number  1    Authorization - Number of Visits  12    PT Start Time  3817    PT Stop Time  1525    PT Time Calculation (min)  40 min    Activity Tolerance  Patient tolerated treatment well;No increased pain    Behavior During Therapy  WFL for tasks assessed/performed       Past Medical History:  Diagnosis Date  . History of pre-eclampsia   . HSV-2 (herpes simplex virus 2) infection   . Nephrolithiasis 2015  . Placenta previa   . Vaginal Pap smear, abnormal     Past Surgical History:  Procedure Laterality Date  . CESAREAN SECTION N/A 02/27/2019   Procedure: CESAREAN SECTION;  Surgeon: Janyth Pupa, DO;  Location: [facility];  Service: Obstetrics;  Laterality: N/A;  . DIAGNOSTIC LAPAROSCOPY WITH REMOVAL OF ECTOPIC PREGNANCY  08/30/2016   Procedure: DIAGNOSTIC LAPAROSCOPYLEFT SALPINGECTOMY WITH REMOVAL OF ECTOPIC PREGNANCY;  Surgeon: Janyth Pupa, DO;  Location: [facility];  Service: Gynecology;;  . LEEP  age 18    There were no vitals filed for this visit.  Subjective Assessment - 06/27/19 1453    Subjective  The tape did not help last visit. No change in pain with the tape. I get as much popping in the pelvis.    Patient Stated Goals  reduce pain, decrease pelvic popping    Currently in Pain?  Yes    Pain Score  8     Pain Location  Back    Pain Orientation  Lower;Right    Pain Descriptors / Indicators  Shooting    Pain Type  Acute pain    Pain Onset  More than a month ago    Pain Frequency  Intermittent    Aggravating Factors   whe first getting up from a sitting position    Pain Relieving Factors  stand and let it go down    Multiple Pain Sites  Yes    Pain Score  8    Pain Location  Groin    Pain Orientation  Left    Pain Descriptors / Indicators  Throbbing    Pain Type  Acute pain    Pain Radiating Towards  move left leg and laying on side which makes the legs stiff    Pain Onset  More than a month ago    Pain Frequency  Intermittent    Aggravating Factors   when she feels a pop noise, rolling in bed    Pain Relieving Factors  lay still         [facility] PT Assessment - 06/27/19 0001      Palpation   SI assessment   left ilium rotated posteriorly                Pelvic Floor Special Questions - 06/27/19 0001    Pelvic Floor Internal Exam  Patient confirms identification and approves  PT to assess the pelvic floor and treatment    Exam Type  Vaginal    Palpation  tenderness located on left levator ani, coccygeus,    Strength  weak squeeze, no lift   weakn lift   Strength # of seconds  5        [facility] Adult PT Treatment/Exercise - 06/27/19 0001      Therapeutic Activites    Therapeutic Activities  Other Therapeutic Activities    Other Therapeutic Activities  gettng in and out of bed with keeping knees together and contraction of the abdomen      Exercises   Exercises  Other Exercises    Other Exercises   pelvic floor contraction and bulging holding for 5 seconds 10x      Lumbar Exercises: Aerobic   Recumbent Bike  6 minutes level 2 while assessing patient      Lumbar Exercises: Supine   Ab Set  10 reps;5 seconds    AB Set Limitations  with tactile cues to contract the lower abdomen, patient has difficulty with feeling if her muscles are contracting so patient will use her hands    Bent Knee Raise  20 reps;1 second    Bent Knee Raise Limitations  with abdominal bracing and control, no popping     Large Ball Oblique Isometric Limitations  supine with alternate shoulder flexion with abdominal bracing      Manual Therapy   Manual Therapy  Neural Stretch;Joint mobilization;Internal Pelvic Floor    Joint Mobilization  rib cage mobilization to bring ribs downward    Internal Pelvic Floor  left levator ani and coccygeus with hip movements    Neural Stretch  right lower extremity             PT Education - 06/27/19 1541    Education Details  Access Code: [email or web address]; standing rock pelvis back and forth; getting in and bed with core strength    Person(s) Educated  Patient    Methods  Explanation;Demonstration;Verbal cues;Handout    Comprehension  Returned demonstration;Verbalized understanding       PT Short Term Goals - 06/14/19 1531      PT SHORT TERM GOAL #1   Title  independent with initial HEP    Time  4    Period  Weeks    Status  Achieved        PT Long Term Goals - 06/20/19 1325      PT LONG TERM GOAL #1   Title  independent with advanced HEP    Baseline  still learning    Time  12    Period  Weeks    Status  On-going      PT LONG TERM GOAL #2   Title  going from sit to stand with pain level </= 1/10 due to pelvis staying in correct alignment    Baseline  right ilium is rotated anteriorly, pain level 7/10    Time  12    Period  Weeks    Status  On-going      PT LONG TERM GOAL #3   Title  able to turn in bed without popping noise due to improved core strength and pain level </= 1/10    Baseline  popping noise, pain level 7/10    Time  12    Period  Weeks    Status  On-going      PT LONG TERM GOAL #4   Title  walking for 30 minutes  with her children with pain level </= 1/10 due to bilateral hip strength >/= 4/5    Baseline  bilateral hip strength 3/5, pain level 5/10    Time  12    Period  Weeks    Status  On-going      PT LONG TERM GOAL #5   Title  pelvic floor strength >/= 3/5 so her pelvis is in correct alignment and reduce lumbar pain     Baseline  2/5 pelvic floor strength, right ilum is rotated anteriory    Time  12    Period  Weeks    Status  On-going            Plan - 06/27/19 1458    Clinical Impression Statement  Patient had no changes with the taping of the SI. Patient is now abel to engage the core better but not able to feel it due to c-section. Patient was able to move in bed with less pain in left SI after manual work. Patient has increased trigger points in the left pelvic floor muscles. Patient had increased back pain due to lumbar tightness. Patient will benefit from skilled therpay to improve core strength, reduce her pain and improve quality of life.    Personal Factors and Comorbidities  Comorbidity 1;Fitness    Comorbidities  c-section scar    Examination-Activity Limitations  Bed Mobility;Locomotion Level;Caring for Others;Stand;Stairs;Lift    Examination-Participation Restrictions  Community Activity;Cleaning    Stability/Clinical Decision Making  Evolving/Moderate complexity    Rehab Potential  Excellent    PT Frequency  2x / week    PT Duration  12 weeks    PT Treatment/Interventions  Biofeedback;Cryotherapy;Electrical Stimulation;Iontophoresis 71m/ml Dexamethasone;Moist Heat;Ultrasound;Neuromuscular re-education;Therapeutic exercise;Therapeutic activities;Patient/family education;Manual techniques;Dry needling;Scar mobilization;Taping;Spinal Manipulations    PT Next Visit Plan  internal left pelvic floor soft tissue work, lumbar mobilization and TL junction, core strength and lumbar strength    PT Home Exercise Plan  Access Code: [email or web address]   Consulted and Agree with Plan of Care  Patient       Patient will benefit from skilled therapeutic intervention in order to improve the following deficits and impairments:  Decreased coordination, Decreased range of motion, Increased fascial restricitons, Difficulty walking, Increased muscle spasms, Decreased activity tolerance, Pain, Decreased scar mobility,  Decreased mobility, Decreased strength  Visit Diagnosis: Chronic low back pain, unspecified back pain laterality, unspecified whether sciatica present  Muscle weakness (generalized)  Back pain, sacroiliac     Problem List Patient Active Problem List   Diagnosis Date Noted  . Placenta previa affecting delivery 02/27/2019  . Placenta previa 01/16/2019  . Routine screening for STI (sexually transmitted infection) 05/24/2018  . Vaginal discharge 05/24/2018  . Exposure to chlamydia 05/24/2018  . Bacterial vaginosis 05/24/2018  . Normal intrauterine pregnancy in third trimester 08/02/2017    CEarlie Counts PT 06/27/19 3:47 PM   [facility] [address] [place] [address]Phone: [phone]  Fax:  [phone] Name: Tanya BIEVERMRN: [id number] of Birth: [birth year] PHYSICAL THERAPY DISCHARGE SUMMARY  Visits from Start of Care: 5  Current functional level related to goals / functional outcomes: See above. Patient has not made more appointments or returned phone call since 06/27/2019.    Remaining deficits: See above.    Education / Equipment: HEP Plan:  Patient goals were not met. Patient is being discharged due to not returning since the last visit.  Thank you for the referral. Earlie Counts, PT 07/11/19 4:50 PM  ?????

## 2021-07-04 ENCOUNTER — Other Ambulatory Visit: Payer: Self-pay | Admitting: Internal Medicine

## 2021-07-04 ENCOUNTER — Ambulatory Visit
Admission: RE | Admit: 2021-07-04 | Discharge: 2021-07-04 | Disposition: A | Discharge status: Home / Self Care | Payer: Medicaid Other | Source: Ambulatory Visit | Attending: Internal Medicine | Admitting: Internal Medicine

## 2021-07-04 DIAGNOSIS — M545 Low back pain, unspecified: Secondary | ICD-10-CM

## 2023-05-04 LAB — HM PAP SMEAR: HM Pap smear: NORMAL

## 2023-05-26 NOTE — L&D Delivery Note (Signed)
   Delivery Note:   [id number] at [redacted]w[redacted]d  Admitting diagnosis: Gestational hypertension [O13.9] Risks:  Patient Active Problem List   Diagnosis Date Noted   Gestational hypertension 03/25/2024   Supervision of high risk pregnancy, antepartum 02/16/2024   History of cesarean section complicating pregnancy 11/30/2023   History of pre-eclampsia in prior pregnancy, currently pregnant in second trimester 11/30/2023   Hx of LEEP (loop electrosurgical excision procedure) of cervix complicating pregnancy, second trimester 11/30/2023   History of preterm premature rupture of membranes (PPROM) 11/30/2023   Pregnancy with history of ectopic pregnancy, antepartum, second trimester 11/30/2023   Advanced maternal age in multigravida, second trimester 11/30/2023   Pregnancy affected by fetal growth restriction 11/30/2023   HSV-2 infection complicating pregnancy, second trimester 11/30/2023    CNM called from MAU to patient room as patient is complete and feeling the urge to push.  Pushing in lithotomy position with CNM and L&D staff support, FOB and mother of the patient present for birth and supportive.   Delivery of a Live born female  Birth Weight:  PENDING  APGAR: 8, 9  Newborn Delivery   Birth date/time: 03/26/2024 07:25:00 Delivery type: Vaginal, Spontaneous    With great maternal pushing efforts, fetus delivered in cephalic presentation, position DOA to LOT. Vigorously crying infant placed immediately skin to skin with mom  Nuchal Cord: No    After several mins of life cord double clamped after cessation of pulsation, and cut by FOB .  Collection of cord blood for typing completed. Cord blood donation-None Arterial cord blood sample-No   Third Stage:  With gentle cord traction Placenta delivered-Spontaneous intact with 3 vessels. Uterine tone firm bleeding minimal Uterotonics: IV pit bolus initiated  Placenta to L&D for dispo.  1st degree;Periurethral;2nd degree;Perineal laceration  identified.  Episiotomy:None Local analgesia: N/A   Repair:1st degree tear repaired to hemostasis using a 3.0 vicryl. 2nd degree perineal repaired in traditional fashion to hemostasis Est. Blood Loss (mL):200 Complications: None   Mom to postpartum.  Baby boy Lang to Couplet care / Skin to Skin.  Delivery Report:   Review the Delivery Report for details.    Jase Reep Erven) Emilio, MSN, CNM  Center for [facility]  03/26/24 8:20 AM

## 2023-09-02 LAB — OB RESULTS CONSOLE RUBELLA ANTIBODY, IGM: Rubella: IMMUNE

## 2023-09-02 LAB — OB RESULTS CONSOLE HEPATITIS B SURFACE ANTIGEN: Hepatitis B Surface Ag: NEGATIVE

## 2023-09-02 LAB — OB RESULTS CONSOLE HIV ANTIBODY (ROUTINE TESTING): HIV: NONREACTIVE

## 2023-09-02 LAB — HEPATITIS C ANTIBODY: HCV Ab: NEGATIVE

## 2023-09-29 LAB — OB RESULTS CONSOLE GC/CHLAMYDIA
Chlamydia: NEGATIVE
Neisseria Gonorrhea: NEGATIVE

## 2023-11-24 ENCOUNTER — Other Ambulatory Visit: Payer: Self-pay | Admitting: Obstetrics and Gynecology

## 2023-11-24 DIAGNOSIS — O365990 Maternal care for other known or suspected poor fetal growth, unspecified trimester, not applicable or unspecified: Secondary | ICD-10-CM

## 2023-11-30 DIAGNOSIS — O0912 Supervision of pregnancy with history of ectopic or molar pregnancy, second trimester: Secondary | ICD-10-CM | POA: Insufficient documentation

## 2023-11-30 DIAGNOSIS — O3442 Maternal care for other abnormalities of cervix, second trimester: Secondary | ICD-10-CM | POA: Insufficient documentation

## 2023-11-30 DIAGNOSIS — O365990 Maternal care for other known or suspected poor fetal growth, unspecified trimester, not applicable or unspecified: Secondary | ICD-10-CM | POA: Insufficient documentation

## 2023-11-30 DIAGNOSIS — Z8759 Personal history of other complications of pregnancy, childbirth and the puerperium: Secondary | ICD-10-CM | POA: Insufficient documentation

## 2023-11-30 DIAGNOSIS — B009 Herpesviral infection, unspecified: Secondary | ICD-10-CM | POA: Insufficient documentation

## 2023-11-30 DIAGNOSIS — O09292 Supervision of pregnancy with other poor reproductive or obstetric history, second trimester: Secondary | ICD-10-CM | POA: Insufficient documentation

## 2023-11-30 DIAGNOSIS — O09522 Supervision of elderly multigravida, second trimester: Secondary | ICD-10-CM | POA: Insufficient documentation

## 2023-11-30 DIAGNOSIS — O34219 Maternal care for unspecified type scar from previous cesarean delivery: Secondary | ICD-10-CM | POA: Insufficient documentation

## 2023-12-10 ENCOUNTER — Other Ambulatory Visit: Payer: Self-pay | Admitting: Obstetrics and Gynecology

## 2023-12-10 ENCOUNTER — Other Ambulatory Visit: Payer: Self-pay | Admitting: *Deleted

## 2023-12-10 ENCOUNTER — Ambulatory Visit: Attending: Obstetrics and Gynecology | Admitting: Obstetrics and Gynecology

## 2023-12-10 ENCOUNTER — Ambulatory Visit

## 2023-12-10 VITALS — BP 122/75 | HR 91

## 2023-12-10 DIAGNOSIS — O09522 Supervision of elderly multigravida, second trimester: Secondary | ICD-10-CM | POA: Diagnosis present

## 2023-12-10 DIAGNOSIS — O3440 Maternal care for other abnormalities of cervix, unspecified trimester: Secondary | ICD-10-CM | POA: Diagnosis not present

## 2023-12-10 DIAGNOSIS — Z362 Encounter for other antenatal screening follow-up: Secondary | ICD-10-CM

## 2023-12-10 DIAGNOSIS — O3442 Maternal care for other abnormalities of cervix, second trimester: Secondary | ICD-10-CM | POA: Diagnosis not present

## 2023-12-10 DIAGNOSIS — Z3A22 22 weeks gestation of pregnancy: Secondary | ICD-10-CM | POA: Insufficient documentation

## 2023-12-10 DIAGNOSIS — O34219 Maternal care for unspecified type scar from previous cesarean delivery: Secondary | ICD-10-CM | POA: Insufficient documentation

## 2023-12-10 DIAGNOSIS — Z363 Encounter for antenatal screening for malformations: Secondary | ICD-10-CM | POA: Insufficient documentation

## 2023-12-10 DIAGNOSIS — O365930 Maternal care for other known or suspected poor fetal growth, third trimester, not applicable or unspecified: Secondary | ICD-10-CM

## 2023-12-10 DIAGNOSIS — O09292 Supervision of pregnancy with other poor reproductive or obstetric history, second trimester: Secondary | ICD-10-CM

## 2023-12-10 DIAGNOSIS — O365990 Maternal care for other known or suspected poor fetal growth, unspecified trimester, not applicable or unspecified: Secondary | ICD-10-CM

## 2023-12-10 DIAGNOSIS — B009 Herpesviral infection, unspecified: Secondary | ICD-10-CM

## 2023-12-10 DIAGNOSIS — O0912 Supervision of pregnancy with history of ectopic or molar pregnancy, second trimester: Secondary | ICD-10-CM

## 2023-12-10 DIAGNOSIS — Z8759 Personal history of other complications of pregnancy, childbirth and the puerperium: Secondary | ICD-10-CM

## 2023-12-10 NOTE — Progress Notes (Signed)
 Maternal-Fetal Medicine Consultation Name: Tanya Gregory MRN: [id number]  G9 P3053 at 22w 3d gestation.  Patient is here for a second opinion.  Her problems include: - Suspected fetal growth restriction.  At your office ultrasound, the femur length measurement was at less than the 3rd percentile.  Patient does not have hypertension or diabetes. - Advanced maternal age (age at delivery 85 years).  On cell free fetal DNA screening, the risks of fetal aneuploidies are not increased. - History of preeclampsia in her first pregnancy. Obstetric history significant for 2 term vaginal deliveries followed by a cesarean delivery (placenta previa).  All her children weighed over 6 pounds at birth. -History of LEEP.  Ultrasound We performed a fetal anatomical survey.  Amniotic fluid is normal good fetal activity seen.  Fetal biometry lags gestational age by 8 days.  The estimated fetal weight is at the 2nd percentile and the abdominal circumference measurement at the 20th percentile.  Femur length measurement is at less than the 1st percentile.  Radii and fibular measurements are within normal range.  Ulna and tibia measures at less than the 5th percentile.  All long bones, spine and calvarium appear normal with no evidence of demineralization or bowing or fractures.  Umbilical artery Doppler showed normal forward diastolic flow. Placenta is posterior and there is no evidence of previa or placenta accreta spectrum. On transabdominal scan, the cervix looks long and closed (history of LEEP).  Suspected fetal growth restriction I explained the finding suggestive of fetal growth restriction.  Fetal biometry, however, lacks gestational age only by 8 days.  I recommend reassessing fetal growth in 3 weeks. I reassured the patient in most cases, fetus is constitutionally small. Placental insufficiency is the most likely cause of fetal growth restriction.  Patient does not give history of fever (infection  unlikely).  Chromosomal anomalies can complicate early fetal growth restriction. I discussed the limitations of cell free fetal DNA screening that has a greater detection rate for Down syndrome but not all chromosomal anomalies.  I explained that only amniocentesis will give a definitive result on the fetal karyotype.  I explained the procedure and possible complication of miscarriage (1 and 500 procedures).  Patient opted not to have amniocentesis. I discussed ultrasound protocol of monitoring fetal growth restriction.  I reassured the patient that I do not suspect skeletal dysplasia.  Previous cesarean delivery I counseled the patient that repeat cesarean delivery is increased the risks of placenta previa and/or placenta accreta spectrum.  Patient is keen on vaginal delivery.  Vaginal birth after cesarean delivery (VBAC) is associated with 1% risk of uterine scar dehiscence.  Cesarean delivery is associated with increased risks of hemorrhage, infection, venous thromboembolism and long-term complication of small bowel obstruction.   Recommendations -An appointment was made for her to return in 3 weeks for fetal growth assessment.  Consultation including face-to-face (more than 50%) counseling 30 minutes.

## 2023-12-21 ENCOUNTER — Other Ambulatory Visit: Payer: Self-pay

## 2023-12-21 MED ORDER — VALACYCLOVIR HCL 500 MG PO TABS
500.0000 mg | ORAL_TABLET | Freq: Every day | ORAL | 11 refills | Status: DC
  Filled 2023-12-21: qty 30, 30d supply, fill #0

## 2023-12-30 ENCOUNTER — Ambulatory Visit: Admitting: Maternal & Fetal Medicine

## 2023-12-30 ENCOUNTER — Other Ambulatory Visit: Payer: Self-pay | Admitting: *Deleted

## 2023-12-30 ENCOUNTER — Ambulatory Visit: Attending: Obstetrics and Gynecology

## 2023-12-30 ENCOUNTER — Other Ambulatory Visit: Payer: Self-pay | Admitting: Obstetrics and Gynecology

## 2023-12-30 VITALS — BP 130/79 | HR 102

## 2023-12-30 DIAGNOSIS — O09292 Supervision of pregnancy with other poor reproductive or obstetric history, second trimester: Secondary | ICD-10-CM | POA: Diagnosis present

## 2023-12-30 DIAGNOSIS — O365990 Maternal care for other known or suspected poor fetal growth, unspecified trimester, not applicable or unspecified: Secondary | ICD-10-CM | POA: Insufficient documentation

## 2023-12-30 DIAGNOSIS — O3442 Maternal care for other abnormalities of cervix, second trimester: Secondary | ICD-10-CM

## 2023-12-30 DIAGNOSIS — O365920 Maternal care for other known or suspected poor fetal growth, second trimester, not applicable or unspecified: Secondary | ICD-10-CM | POA: Diagnosis not present

## 2023-12-30 DIAGNOSIS — Z8759 Personal history of other complications of pregnancy, childbirth and the puerperium: Secondary | ICD-10-CM | POA: Insufficient documentation

## 2023-12-30 DIAGNOSIS — O09522 Supervision of elderly multigravida, second trimester: Secondary | ICD-10-CM

## 2023-12-30 DIAGNOSIS — Z362 Encounter for other antenatal screening follow-up: Secondary | ICD-10-CM | POA: Diagnosis present

## 2023-12-30 DIAGNOSIS — O34219 Maternal care for unspecified type scar from previous cesarean delivery: Secondary | ICD-10-CM | POA: Diagnosis present

## 2023-12-30 DIAGNOSIS — Z3A25 25 weeks gestation of pregnancy: Secondary | ICD-10-CM | POA: Insufficient documentation

## 2023-12-30 NOTE — Progress Notes (Signed)
 Patient information  Patient Name: Tanya Gregory  Patient MRN:   [id number]  Referring practice: MFM Referring Provider: Margarete SHIPPER  Problem List   Patient Active Problem List   Diagnosis Date Noted   History of cesarean section complicating pregnancy 11/30/2023   History of pre-eclampsia in prior pregnancy, currently pregnant in second trimester 11/30/2023   Hx of LEEP (loop electrosurgical excision procedure) of cervix complicating pregnancy, second trimester 11/30/2023   History of preterm premature rupture of membranes (PPROM) 11/30/2023   Pregnancy with history of ectopic pregnancy, antepartum, second trimester 11/30/2023   Advanced maternal age in multigravida, second trimester 11/30/2023   Pregnancy affected by fetal growth restriction 11/30/2023   HSV-2 infection complicating pregnancy, second trimester 11/30/2023   Maternal Fetal medicine Consult  Tanya Gregory is a 43 y.o. [id number] at 101w2d here for ultrasound and consultation. Tanya Gregory is doing well today with no acute concerns. Today we focused on the following:   FGR: There continues to be FGR on today's ultrasound.  The estimated fetal weight is at the 3 point 5th percentile.  The head circumference and abdominal circumference are more normal at the 55th and 30th percentile respectively.  I discussed this could represent some degree of brain sparing.  The umbilical artery Dopplers are normal.  I discussed that the long bones all do appear short but are not extremely short and they have a normal morphology without evidence of fracture or bowing.  I suspect the ultrasound findings today are representative of a combination of constitutionally small limbs with possible placental insufficiency leading degree of fetal growth restriction.  I discussed the importance of umbilical artery Dopplers as well as serial growth ultrasounds and alteration of delivery timing to reduce the risk of stillbirth.  Currently  delivery timing will likely be around 37 weeks if the current growth pattern persists.  The patient had time to ask questions that were answered to her satisfaction.  She verbalized understanding and agrees to proceed with the plan below.  Sonographic findings Single intrauterine pregnancy at 25w 2d. Fetal cardiac activity:  Observed and appears normal. Presentation: Breech. Interval fetal anatomy appears normal.  There is no evidence of frontal bossing or limb deformities such as fracture, bowing or decreased mineralization. Fetal biometry shows the estimated fetal weight at the 3.5 percentile and the abdominal circumference at the 30th percentile.  Amniotic fluid: Within normal limits.  MVP: 6.9 cm. Placenta: Posterior. Umbilical artery dopplers findings: -S/D:3.47 which are normal at this gestational age.  -Absent end-diastolic flow: No.  -Reversed end-diastolic flow:  No.  There are limitations of prenatal ultrasound such as the inability to detect certain abnormalities due to poor visualization. Various factors such as fetal position, gestational age and maternal body habitus may increase the difficulty in visualizing the fetal anatomy.    Recommendations - Continue weekly UA dopplers and antenatal testing until delivery. - Serial growth US  every 3 weeks until delivery. - Delivery likely around 37 weeks or sooner if indicated.   Review of Systems: A review of systems was performed and was negative except per HPI   Vitals and Physical Exam    12/30/2023    2:16 PM 12/10/2023    7:11 AM 03/01/2019    6:07 AM  Vitals with BMI  Systolic 130 122 98  Diastolic 79 75 69  Pulse 102 91 60    Sitting comfortably on the sonogram table Nonlabored breathing Normal rate and rhythm Abdomen is nontender  Past pregnancies  OB History  Gravida Para Term Preterm AB Living  9 3 3  5 3   SAB IAB Ectopic Multiple Live Births  3 1 1  0 3    # Outcome Date GA Lbr Len/2nd Weight Sex Type Anes  PTL Lv  9 Current           8 SAB 03/2022     SAB     7 Term 02/27/19 [redacted]w[redacted]d  8 lb 3.9 oz (3.739 kg) M CS-Vac Spinal  LIV  6 Term 08/02/17 [redacted]w[redacted]d    Vag-Spont   LIV  5 Ectopic 2018          4 IAB 2013          3 Term 10/2009 [redacted]w[redacted]d   F Vag-Spont   LIV     Birth Comments: pre eclampsia  2 SAB 2008          1 SAB 05/27/99 [redacted]w[redacted]d    SAB   FD    Obstetric Comments  1st SAB at 4 months     I spent 30 minutes reviewing the patients chart, including labs and images as well as counseling the patient about her medical conditions. Greater than 50% of the time was spent in direct face-to-face patient counseling.  Delora Smaller  MFM, [facility]   12/30/2023  3:38 PM

## 2024-01-13 ENCOUNTER — Ambulatory Visit: Attending: Obstetrics and Gynecology

## 2024-01-13 ENCOUNTER — Ambulatory Visit (HOSPITAL_BASED_OUTPATIENT_CLINIC_OR_DEPARTMENT_OTHER): Admitting: Obstetrics

## 2024-01-13 VITALS — BP 123/72 | HR 85

## 2024-01-13 DIAGNOSIS — Z8759 Personal history of other complications of pregnancy, childbirth and the puerperium: Secondary | ICD-10-CM | POA: Diagnosis present

## 2024-01-13 DIAGNOSIS — Z3A27 27 weeks gestation of pregnancy: Secondary | ICD-10-CM | POA: Insufficient documentation

## 2024-01-13 DIAGNOSIS — O34219 Maternal care for unspecified type scar from previous cesarean delivery: Secondary | ICD-10-CM | POA: Diagnosis not present

## 2024-01-13 DIAGNOSIS — Z9889 Other specified postprocedural states: Secondary | ICD-10-CM

## 2024-01-13 DIAGNOSIS — O3442 Maternal care for other abnormalities of cervix, second trimester: Secondary | ICD-10-CM

## 2024-01-13 DIAGNOSIS — O365920 Maternal care for other known or suspected poor fetal growth, second trimester, not applicable or unspecified: Secondary | ICD-10-CM | POA: Diagnosis not present

## 2024-01-13 DIAGNOSIS — O09522 Supervision of elderly multigravida, second trimester: Secondary | ICD-10-CM | POA: Insufficient documentation

## 2024-01-13 DIAGNOSIS — O09292 Supervision of pregnancy with other poor reproductive or obstetric history, second trimester: Secondary | ICD-10-CM

## 2024-01-13 DIAGNOSIS — O365990 Maternal care for other known or suspected poor fetal growth, unspecified trimester, not applicable or unspecified: Secondary | ICD-10-CM

## 2024-01-13 NOTE — Progress Notes (Signed)
 MFM Note  Tanya Gregory is currently at 27 weeks and 2 days.  She has been followed due to advanced maternal age and IUGR.    There was normal amniotic fluid noted on today's exam with a maximum vertical pocket of 6.02 cm.  Doppler studies of the umbilical arteries performed due to IUGR showed a normal S/D ratio of 3.15.  There were no signs of absent or reversed end-diastolic flow noted today.  She will return in 1 week for a growth scan and umbilical artery Doppler study.  We will also start weekly fetal testing next week.

## 2024-01-18 LAB — GLUCOSE TOLERANCE, 1 HOUR: Glucose, 1 Hour GTT: 99.9

## 2024-01-18 LAB — OB RESULTS CONSOLE HGB/HCT, BLOOD
HCT: 37 (ref 29–41)
Hemoglobin: 13.3

## 2024-01-18 LAB — OB RESULTS CONSOLE RPR: RPR: NONREACTIVE

## 2024-01-18 LAB — OB RESULTS CONSOLE PLATELET COUNT: Platelets: 261

## 2024-01-20 ENCOUNTER — Ambulatory Visit

## 2024-01-20 ENCOUNTER — Ambulatory Visit: Attending: Obstetrics and Gynecology

## 2024-01-20 ENCOUNTER — Ambulatory Visit: Admitting: Obstetrics and Gynecology

## 2024-01-20 ENCOUNTER — Other Ambulatory Visit: Payer: Self-pay | Admitting: *Deleted

## 2024-01-20 VITALS — BP 128/69 | HR 86

## 2024-01-20 DIAGNOSIS — O365990 Maternal care for other known or suspected poor fetal growth, unspecified trimester, not applicable or unspecified: Secondary | ICD-10-CM | POA: Diagnosis present

## 2024-01-20 DIAGNOSIS — O09523 Supervision of elderly multigravida, third trimester: Secondary | ICD-10-CM

## 2024-01-20 DIAGNOSIS — O09522 Supervision of elderly multigravida, second trimester: Secondary | ICD-10-CM

## 2024-01-20 DIAGNOSIS — O99210 Obesity complicating pregnancy, unspecified trimester: Secondary | ICD-10-CM

## 2024-01-20 DIAGNOSIS — Z3A28 28 weeks gestation of pregnancy: Secondary | ICD-10-CM | POA: Insufficient documentation

## 2024-01-20 DIAGNOSIS — O365930 Maternal care for other known or suspected poor fetal growth, third trimester, not applicable or unspecified: Secondary | ICD-10-CM | POA: Insufficient documentation

## 2024-01-20 DIAGNOSIS — O3443 Maternal care for other abnormalities of cervix, third trimester: Secondary | ICD-10-CM

## 2024-01-20 DIAGNOSIS — O09293 Supervision of pregnancy with other poor reproductive or obstetric history, third trimester: Secondary | ICD-10-CM | POA: Diagnosis not present

## 2024-01-20 DIAGNOSIS — O34219 Maternal care for unspecified type scar from previous cesarean delivery: Secondary | ICD-10-CM | POA: Diagnosis not present

## 2024-01-20 NOTE — Progress Notes (Signed)
 Maternal-Fetal Medicine Consultation Name: Barbette Mcglaun MRN: [id number]  G9 P3053 at 28w 2d gestation. Fetal growth restriction.  On ultrasound performed 3 weeks ago, the estimated fetal weight was at the 4th percentile. Advanced maternal age.  On cell free fetal DNA screening, the risks of fetal aneuploidy's are not increased.  Patient reports she does not have gestational diabetes.  Blood pressure today at our office is 128/69 mmHg.  Ultrasound On today's ultrasound, fetal growth is appropriate for gestational age.  The estimated fetal weight is at the 11th percentile and the abdominal circumference measurement is at the 54th percentile.  Amniotic fluid is normal and good fetal activity seen.  Umbilical artery Doppler showed normal forward diastolic flow. NST was not performed today.  Fetal growth restriction I explained the finding of resolution of fetal growth assessment on today's ultrasound.  Ultrasound has a margin of error (up to 15%).  I counseled the patient that weekly antenatal testing need not be performed unless her next growth assessment shows fetal growth restriction. Her previous child weighed around 7 pounds at birth.  Patient reports she has pedal edema.  Pitting edema is confirmed at the left ankle.  Blood pressure today at our office is 128/69 mmHg.  I reassured the patient that given her blood pressures are normal, pedal edema is physiological.  Recommendations - An appointment was made for her to return in 3 weeks for fetal growth assessment.   Consultation including face-to-face (more than 50%) counseling 20 minutes.

## 2024-02-10 ENCOUNTER — Ambulatory Visit (HOSPITAL_BASED_OUTPATIENT_CLINIC_OR_DEPARTMENT_OTHER)

## 2024-02-10 ENCOUNTER — Ambulatory Visit: Attending: Obstetrics and Gynecology | Admitting: Obstetrics and Gynecology

## 2024-02-10 ENCOUNTER — Other Ambulatory Visit: Payer: Self-pay | Admitting: *Deleted

## 2024-02-10 VITALS — BP 130/71 | HR 108

## 2024-02-10 DIAGNOSIS — O99210 Obesity complicating pregnancy, unspecified trimester: Secondary | ICD-10-CM

## 2024-02-10 DIAGNOSIS — O99213 Obesity complicating pregnancy, third trimester: Secondary | ICD-10-CM

## 2024-02-10 DIAGNOSIS — O09523 Supervision of elderly multigravida, third trimester: Secondary | ICD-10-CM | POA: Diagnosis not present

## 2024-02-10 DIAGNOSIS — O09293 Supervision of pregnancy with other poor reproductive or obstetric history, third trimester: Secondary | ICD-10-CM | POA: Diagnosis not present

## 2024-02-10 DIAGNOSIS — O365990 Maternal care for other known or suspected poor fetal growth, unspecified trimester, not applicable or unspecified: Secondary | ICD-10-CM

## 2024-02-10 DIAGNOSIS — O365930 Maternal care for other known or suspected poor fetal growth, third trimester, not applicable or unspecified: Secondary | ICD-10-CM | POA: Diagnosis present

## 2024-02-10 DIAGNOSIS — E669 Obesity, unspecified: Secondary | ICD-10-CM | POA: Diagnosis not present

## 2024-02-10 DIAGNOSIS — O34219 Maternal care for unspecified type scar from previous cesarean delivery: Secondary | ICD-10-CM | POA: Insufficient documentation

## 2024-02-10 DIAGNOSIS — Z9889 Other specified postprocedural states: Secondary | ICD-10-CM

## 2024-02-10 DIAGNOSIS — Z3A31 31 weeks gestation of pregnancy: Secondary | ICD-10-CM

## 2024-02-10 DIAGNOSIS — O09522 Supervision of elderly multigravida, second trimester: Secondary | ICD-10-CM

## 2024-02-10 DIAGNOSIS — O3442 Maternal care for other abnormalities of cervix, second trimester: Secondary | ICD-10-CM

## 2024-02-10 DIAGNOSIS — O09292 Supervision of pregnancy with other poor reproductive or obstetric history, second trimester: Secondary | ICD-10-CM

## 2024-02-10 NOTE — Progress Notes (Signed)
 After review, MFM consult with provider is not indicated for today  Arna Ranks, MD 02/10/2024 4:33 PM  [facility]

## 2024-02-16 ENCOUNTER — Encounter: Payer: Self-pay | Admitting: Obstetrics & Gynecology

## 2024-02-16 DIAGNOSIS — O0990 Supervision of high risk pregnancy, unspecified, unspecified trimester: Secondary | ICD-10-CM | POA: Insufficient documentation

## 2024-02-17 ENCOUNTER — Encounter: Payer: Self-pay | Admitting: Obstetrics & Gynecology

## 2024-02-17 ENCOUNTER — Ambulatory Visit (INDEPENDENT_AMBULATORY_CARE_PROVIDER_SITE_OTHER): Admitting: Obstetrics & Gynecology

## 2024-02-17 ENCOUNTER — Encounter: Admitting: *Deleted

## 2024-02-17 VITALS — BP 128/81 | HR 94 | Wt 197.8 lb

## 2024-02-17 DIAGNOSIS — O98513 Other viral diseases complicating pregnancy, third trimester: Secondary | ICD-10-CM

## 2024-02-17 DIAGNOSIS — K644 Residual hemorrhoidal skin tags: Secondary | ICD-10-CM

## 2024-02-17 DIAGNOSIS — B009 Herpesviral infection, unspecified: Secondary | ICD-10-CM

## 2024-02-17 DIAGNOSIS — O34219 Maternal care for unspecified type scar from previous cesarean delivery: Secondary | ICD-10-CM

## 2024-02-17 DIAGNOSIS — O09523 Supervision of elderly multigravida, third trimester: Secondary | ICD-10-CM | POA: Diagnosis not present

## 2024-02-17 DIAGNOSIS — Z3A32 32 weeks gestation of pregnancy: Secondary | ICD-10-CM | POA: Diagnosis not present

## 2024-02-17 DIAGNOSIS — O2243 Hemorrhoids in pregnancy, third trimester: Secondary | ICD-10-CM

## 2024-02-17 DIAGNOSIS — O0990 Supervision of high risk pregnancy, unspecified, unspecified trimester: Secondary | ICD-10-CM

## 2024-02-17 DIAGNOSIS — A609 Anogenital herpesviral infection, unspecified: Secondary | ICD-10-CM

## 2024-02-17 MED ORDER — HYDROCORTISONE 1 % EX CREA: 1.0000 | TOPICAL_CREAM | Freq: Two times a day (BID) | CUTANEOUS | 0 refills | Status: DC

## 2024-02-17 MED ORDER — DOCUSATE SODIUM 100 MG PO CAPS
100.0000 mg | ORAL_CAPSULE | Freq: Two times a day (BID) | ORAL | 3 refills | Status: AC
Start: 2024-02-17 — End: 2024-03-18

## 2024-02-17 MED ORDER — VALACYCLOVIR HCL 500 MG PO TABS: 500.0000 mg | ORAL_TABLET | Freq: Two times a day (BID) | ORAL | 2 refills | Status: DC

## 2024-02-17 NOTE — Progress Notes (Addendum)
 HIGH-RISK PREGNANCY VISIT Patient name: Tanya Gregory MRN [id number]  Date of birth: 1980/06/16 Chief Complaint:   Initial Prenatal Visit (Transfer from [place])  History of Present Illness:   Tanya Gregory is a 43 y.o. [id number] female at [redacted]w[redacted]d with an Estimated Date of Delivery: 04/11/24 being seen today for initial prenatal visit-late transfer of care from [facility].  Pt has been seen regularly; however their office is closing.    Pregnancy complicated by:   MERTHA -Prior C-section Placenta previa in prior pregnancy (2020) Desires TOLAC -Borderline FGR- follow up growth within normal range Pt being followed by MFM -h/o HSV  Reports painful hemorrhoid  Contractions: Not present. Vag. Bleeding: None.  Movement: Present. denies leaking of fluid.      02/17/2024    1:55 PM 05/06/2016    3:42 PM  Depression screen PHQ 2/9  Decreased Interest 0 0  Down, Depressed, Hopeless 0 0  PHQ - 2 Score 0 0  Altered sleeping 0   Tired, decreased energy 0   Change in appetite 0   Feeling bad or failure about yourself  0   Trouble concentrating 0   Moving slowly or fidgety/restless 0   Suicidal thoughts 0   PHQ-9 Score 0      Current Outpatient Medications  Medication Instructions   acetaminophen  (TYLENOL ) 650 mg, Oral, Every 6 hours PRN   aspirin EC 81 mg, Daily   calcium  carbonate (TUMS - DOSED IN MG ELEMENTAL CALCIUM ) 500 MG chewable tablet 1-2 tablets, Daily PRN   docusate sodium  (COLACE) 100 mg, Oral, 2 times daily   famotidine  (PEPCID ) 10 mg, Oral, 2 times daily PRN   hydrocortisone  cream 1 % 1 Application, Topical, 2 times daily   ibuprofen  (ADVIL ) 600 mg, Oral, Every 6 hours PRN   Prenatal Vit-Fe Fumarate-FA (PRENATAL MULTIVITAMIN) TABS tablet 1 tablet, Daily   valACYclovir  (VALTREX ) 500 mg, Oral, 2 times daily     Review of Systems:   Pertinent items are noted in HPI Denies abnormal vaginal discharge w/ itching/odor/irritation, headaches, visual changes,  shortness of breath, chest pain, abdominal pain, severe nausea/vomiting, or problems with urination or bowel movements unless otherwise stated above. Pertinent History Reviewed:  Reviewed past medical,surgical, social, obstetrical and family history.  Reviewed problem list, medications and allergies. Physical Assessment:   Vitals:   02/17/24 1348 02/17/24 1427  BP: (!) 136/92 128/81  Pulse: 90 94  Weight: 197 lb 12.8 oz (89.7 kg)   Body mass index is 32.92 kg/m.           Physical Examination:   General appearance: alert, well appearing, and in no distress  Mental status: normal mood, behavior, speech, dress, motor activity, and thought processes  Skin: warm & dry   Extremities:      Cardiovascular: normal heart rate noted  Respiratory: normal respiratory effort, no distress  Abdomen: gravid, soft, non-tender  Pelvic: Cervical exam deferred   Moderate external hemorrhoid noted- not thrombosed        Fetal Status: Fetal Heart Rate (bpm): 150 Fundal Height: 30 cm Movement: Present    Fetal Surveillance Testing today: doppler   Chaperone: N/A    No results found for this or any previous visit (from the past 24 hours).   Assessment & Plan:  High-risk pregnancy: [id number] at [redacted]w[redacted]d with an Estimated Date of Delivery: 04/11/24   1) Desires TOLAC - We discussed her history of c-section. Her previous c-section was due to  placenta previa.  She has  a history of  prior vaginal delivery x 2 (before C_section) - We discussed the risks associated with repeat c-section: bleeding, infection, injury to surrounding organs/tissues I.e. bowel/bladder, development of scar tissue, wound complications such as wound separation or infection, need for additional surgery and discussed potential for future complications such as placenta accreta spectrum  nm- We discussed the risks associated with TOLAC specifically focusing on the risk of uterine rupture. We discussed with the risk of uterine rupture that  while rare it is not easily predicted, that it is a surgical emergency, and it can be potentially catastrophic for mom and baby.  - After counseling, the patient was given the opportunity to ask questions and all questions answered.  - After considering her options, she would like to [facility] - Information provided to the patient  2) AMA 3) Hemorrhoid -reviewed conservative management -Rx sent in  4) H/o HSV -Rx for valtrex  sent in  Meds:  Meds ordered this encounter  Medications   docusate sodium  (COLACE) 100 MG capsule    Sig: Take 1 capsule (100 mg total) by mouth 2 (two) times daily.    Dispense:  60 capsule    Refill:  3   hydrocortisone  cream 1 %    Sig: Apply 1 Application topically 2 (two) times daily.    Dispense:  30 g    Refill:  0   valACYclovir  (VALTREX ) 500 MG tablet    Sig: Take 1 tablet (500 mg total) by mouth 2 (two) times daily.    Dispense:  60 tablet    Refill:  2    Labs/procedures today: na  Treatment Plan:  routine OB care and as outlined above  Reviewed: Preterm labor symptoms and general obstetric precautions including but not limited to vaginal bleeding, contractions, leaking of fluid and fetal movement were reviewed in detail with the patient.  All questions were answered.   Follow-up: Return in about 2 weeks (around 03/02/2024) for LROB visit, with Dr. Catlin Aycock.   Future Appointments  Date Time Provider Department Center  02/28/2024 10:50 AM Marilynn Nest, DO [facility]  03/14/2024  2:00 PM [facility] PROVIDER 1 [facility]  03/14/2024  2:15 PM [facility] US3 [facility]    Orders Placed This Encounter  Procedures   OB RESULTS CONSOLE RPR   OB RESULTS CONSOLE Hemoglobin and hematocrit, blood   OB RESULTS CONSOLE PLATELET COUNT   HM PAP SMEAR   OB RESULTS CONSOLE GC/Chlamydia   OB RESULTS CONSOLE HIV antibody   OB RESULTS CONSOLE Rubella Antibody   OB RESULTS CONSOLE Hepatitis B surface antigen   Hepatitis C antibody   Glucose tolerance, 1  hour    Hershal Eriksson, DO Attending Obstetrician & Gynecologist, [facility] [employer], [facility]

## 2024-02-17 NOTE — Addendum Note (Signed)
 Addended by: MARILYNN DELON HERO on: 02/17/2024 03:04 PM   Modules accepted: Orders

## 2024-02-28 ENCOUNTER — Encounter: Payer: Self-pay | Admitting: Obstetrics & Gynecology

## 2024-02-28 ENCOUNTER — Ambulatory Visit: Admitting: Obstetrics & Gynecology

## 2024-02-28 VITALS — BP 125/78 | HR 102 | Wt 200.2 lb

## 2024-02-28 DIAGNOSIS — O34219 Maternal care for unspecified type scar from previous cesarean delivery: Secondary | ICD-10-CM | POA: Diagnosis not present

## 2024-02-28 DIAGNOSIS — O3443 Maternal care for other abnormalities of cervix, third trimester: Secondary | ICD-10-CM

## 2024-02-28 DIAGNOSIS — Z9889 Other specified postprocedural states: Secondary | ICD-10-CM

## 2024-02-28 DIAGNOSIS — Z3A33 33 weeks gestation of pregnancy: Secondary | ICD-10-CM | POA: Diagnosis not present

## 2024-02-28 DIAGNOSIS — O09523 Supervision of elderly multigravida, third trimester: Secondary | ICD-10-CM

## 2024-02-28 NOTE — Progress Notes (Signed)
   LOW-RISK PREGNANCY VISIT Patient name: Tanya Gregory MRN [id number]  Date of birth: 1981/01/01 Chief Complaint:   Routine Prenatal Visit  History of Present Illness:   Tanya Gregory is a 43 y.o. [id number] female at [redacted]w[redacted]d with an Estimated Date of Delivery: 04/11/24 being seen today for ongoing management of a low-risk pregnancy.   -desires TOLAC -elevated AFP -AMA -h/o HSV      02/17/2024    1:55 PM 05/06/2016    3:42 PM  Depression screen PHQ 2/9  Decreased Interest 0 0  Down, Depressed, Hopeless 0 0  PHQ - 2 Score 0 0  Altered sleeping 0   Tired, decreased energy 0   Change in appetite 0   Feeling bad or failure about yourself  0   Trouble concentrating 0   Moving slowly or fidgety/restless 0   Suicidal thoughts 0   PHQ-9 Score 0     Today she reports pelvic pressure. Contractions: Irritability. Vag. Bleeding: None.  Movement: Present. denies leaking of fluid. Review of Systems:   Pertinent items are noted in HPI Denies abnormal vaginal discharge w/ itching/odor/irritation, headaches, visual changes, shortness of breath, chest pain, abdominal pain, severe nausea/vomiting, or problems with urination or bowel movements unless otherwise stated above. Pertinent History Reviewed:  Reviewed past medical,surgical, social, obstetrical and family history.  Reviewed problem list, medications and allergies.  Physical Assessment:   Vitals:   02/28/24 1040  BP: 125/78  Pulse: (!) 102  Weight: 200 lb 3.2 oz (90.8 kg)  Body mass index is 33.32 kg/m.        Physical Examination:   General appearance: Well appearing, and in no distress  Mental status: Alert, oriented to person, place, and time  Skin: Warm & dry  Respiratory: Normal respiratory effort, no distress  Abdomen: Soft, gravid, nontender  Pelvic: Cervical exam deferred         Extremities:  1+ edema  Psych:  mood and affect appropriate  Fetal Status:     Movement: Present    Chaperone: n/a    No  results found for this or any previous visit (from the past 24 hours).   Assessment & Plan:  1) Low-risk pregnancy [id number] at [redacted]w[redacted]d with an Estimated Date of Delivery: 04/11/24   -desires TOLAC -elevated AFP- followed by MFM.  US  scheduled 10/21 -AMA -h/o HSV   Meds: No orders of the defined types were placed in this encounter.  Labs/procedures today: doppler  Plan:  Continue routine obstetrical care  Next visit: prefers in person    Reviewed: Preterm labor symptoms and general obstetric precautions including but not limited to vaginal bleeding, contractions, leaking of fluid and fetal movement were reviewed in detail with the patient.  All questions were answered.   Follow-up: Return in about 2 weeks (around 03/13/2024) for LROB visit.  No orders of the defined types were placed in this encounter.   Deshon Koslowski, DO Attending Obstetrician & Gynecologist, [facility] for [employer], [facility]

## 2024-03-14 ENCOUNTER — Other Ambulatory Visit: Payer: Self-pay | Admitting: *Deleted

## 2024-03-14 ENCOUNTER — Other Ambulatory Visit: Payer: Self-pay | Admitting: Obstetrics and Gynecology

## 2024-03-14 ENCOUNTER — Ambulatory Visit: Attending: Obstetrics and Gynecology | Admitting: Obstetrics

## 2024-03-14 ENCOUNTER — Ambulatory Visit (HOSPITAL_BASED_OUTPATIENT_CLINIC_OR_DEPARTMENT_OTHER)

## 2024-03-14 VITALS — BP 119/80

## 2024-03-14 DIAGNOSIS — Z3A36 36 weeks gestation of pregnancy: Secondary | ICD-10-CM | POA: Insufficient documentation

## 2024-03-14 DIAGNOSIS — O3443 Maternal care for other abnormalities of cervix, third trimester: Secondary | ICD-10-CM | POA: Insufficient documentation

## 2024-03-14 DIAGNOSIS — O365990 Maternal care for other known or suspected poor fetal growth, unspecified trimester, not applicable or unspecified: Secondary | ICD-10-CM | POA: Diagnosis not present

## 2024-03-14 DIAGNOSIS — O09293 Supervision of pregnancy with other poor reproductive or obstetric history, third trimester: Secondary | ICD-10-CM

## 2024-03-14 DIAGNOSIS — O09523 Supervision of elderly multigravida, third trimester: Secondary | ICD-10-CM

## 2024-03-14 DIAGNOSIS — O34219 Maternal care for unspecified type scar from previous cesarean delivery: Secondary | ICD-10-CM | POA: Diagnosis not present

## 2024-03-14 DIAGNOSIS — O365930 Maternal care for other known or suspected poor fetal growth, third trimester, not applicable or unspecified: Secondary | ICD-10-CM | POA: Insufficient documentation

## 2024-03-14 NOTE — Progress Notes (Signed)
 MFM Consult Note  Tanya Gregory is currently at 36 weeks and 0 days.  She has been followed due to advanced maternal age (43 years old) and IUGR.    She denies any problems since her last exam and reports feeling fetal movements throughout the day.  Sonographic findings Single intrauterine pregnancy. Fetal cardiac activity: Observed. Presentation: Cephalic. Fetal biometry shows the estimated fetal weight of 5 pounds 13 ounces which measures at the 27th percentile, indicating that the previously noted IUGR has resolved.   The femur lengths continue to measure short (about 4 weeks behind her dates).  This has been noted throughout her pregnancy. Amniotic fluid: Within normal limits.  AFI 19.44 cm.  MVP: 5.56 cm. Placenta: Posterior. A BPP performed today was 8/8.  Due to advanced maternal age, she should continue to be followed with weekly fetal testing until delivery.  The patient reports that she may have her future NSTs performed at [facility].  Due to advanced maternal age over 76, delivery is recommended at around 39 weeks.  She will discuss scheduling an induction with you at her next prenatal visit.  The patient stated that all of her questions were answered today.  A total of 10 minutes was spent counseling and coordinating the care for this patient.  Greater than 50% of the time was spent in direct face-to-face contact.

## 2024-03-16 ENCOUNTER — Ambulatory Visit: Admitting: Obstetrics & Gynecology

## 2024-03-16 ENCOUNTER — Other Ambulatory Visit (HOSPITAL_COMMUNITY)
Admission: RE | Admit: 2024-03-16 | Discharge: 2024-03-16 | Disposition: A | Source: Ambulatory Visit | Attending: Obstetrics & Gynecology | Admitting: Obstetrics & Gynecology

## 2024-03-16 ENCOUNTER — Encounter: Payer: Self-pay | Admitting: Obstetrics & Gynecology

## 2024-03-16 VITALS — BP 129/81 | HR 74 | Wt 201.2 lb

## 2024-03-16 DIAGNOSIS — O34219 Maternal care for unspecified type scar from previous cesarean delivery: Secondary | ICD-10-CM | POA: Diagnosis not present

## 2024-03-16 DIAGNOSIS — O0990 Supervision of high risk pregnancy, unspecified, unspecified trimester: Secondary | ICD-10-CM

## 2024-03-16 DIAGNOSIS — O09523 Supervision of elderly multigravida, third trimester: Secondary | ICD-10-CM

## 2024-03-16 DIAGNOSIS — O0993 Supervision of high risk pregnancy, unspecified, third trimester: Secondary | ICD-10-CM | POA: Diagnosis present

## 2024-03-16 DIAGNOSIS — Z3A36 36 weeks gestation of pregnancy: Secondary | ICD-10-CM | POA: Insufficient documentation

## 2024-03-16 NOTE — Progress Notes (Signed)
 HIGH-RISK PREGNANCY VISIT Patient name: Tanya Gregory MRN [id number]  Date of birth: 10/13/1980 Chief Complaint:   Routine Prenatal Visit  History of Present Illness:   Tanya Gregory is a 43 y.o. [id number] female at [redacted]w[redacted]d with an Estimated Date of Delivery: 04/11/24 being seen today for ongoing management of a high-risk pregnancy complicated by:  -FGR- resolved, last growth 10/21- vertex/post/EFW 264g (32%), short femur noted. BPP 8/8  -Prior C-section, desires TOLAC -AMA -h/o HSV -asymptomatic on suppression therapy  Notes feeling significant pelvic pressure and discomfort.  Contractions: Irritability. Vag. Bleeding: None.  Movement: Present. denies leaking of fluid.      02/17/2024    1:55 PM 05/06/2016    3:42 PM  Depression screen PHQ 2/9  Decreased Interest 0 0  Down, Depressed, Hopeless 0 0  PHQ - 2 Score 0 0  Altered sleeping 0   Tired, decreased energy 0   Change in appetite 0   Feeling bad or failure about yourself  0   Trouble concentrating 0   Moving slowly or fidgety/restless 0   Suicidal thoughts 0   PHQ-9 Score 0      Current Outpatient Medications  Medication Instructions   acetaminophen  (TYLENOL ) 650 mg, Oral, Every 6 hours PRN   aspirin EC 81 mg, Daily   calcium  carbonate (TUMS - DOSED IN MG ELEMENTAL CALCIUM ) 500 MG chewable tablet 1-2 tablets, Daily PRN   docusate sodium  (COLACE) 100 mg, Oral, 2 times daily   famotidine  (PEPCID ) 10 mg, Oral, 2 times daily PRN   hydrocortisone  cream 1 % 1 Application, Topical, 2 times daily   ibuprofen  (ADVIL ) 600 mg, Oral, Every 6 hours PRN   Prenatal Vit-Fe Fumarate-FA (PRENATAL MULTIVITAMIN) TABS tablet 1 tablet, Daily   valACYclovir  (VALTREX ) 500 mg, Oral, 2 times daily     Review of Systems:   Pertinent items are noted in HPI Denies abnormal vaginal discharge w/ itching/odor/irritation, headaches, visual changes, shortness of breath, chest pain, abdominal pain, severe nausea/vomiting, or problems  with urination or bowel movements unless otherwise stated above. Pertinent History Reviewed:  Reviewed past medical,surgical, social, obstetrical and family history.  Reviewed problem list, medications and allergies. Physical Assessment:   Vitals:   03/16/24 0942  BP: 129/81  Pulse: 74  Weight: 201 lb 3.2 oz (91.3 kg)  Body mass index is 33.48 kg/m.           Physical Examination:   General appearance: alert, well appearing, and in no distress  Mental status: normal mood, behavior, speech, dress, motor activity, and thought processes  Skin: warm & dry   Extremities:   1+ edema   Cardiovascular: normal heart rate noted  Respiratory: normal respiratory effort, no distress  Abdomen: gravid, soft, non-tender  Pelvic: Cervical exam performed  Dilation: Closed Effacement (%): 20 Station: -3 vaginal swabs obtained  Fetal Status: Fetal Heart Rate (bpm): 145   Movement: Present    Fetal Surveillance Testing today: doppler   Chaperone: Alan Fischer    No results found for this or any previous visit (from the past 24 hours).   Assessment & Plan:  High-risk pregnancy: [id number] at [redacted]w[redacted]d with an Estimated Date of Delivery: 04/11/24   1) AMA, prior FGR -normal growth -will continue antepartum testing weekly -per MFM recommendation for IOL @ 39wk []  plan to scheduled for 11/11 daytime  2) h/o HSV On suppression  3) TOLAC  Meds: No orders of the defined types were placed in this encounter.   Labs/procedures today:  GBS, GC/C  Treatment Plan:  as outlined above and routine OB care  Reviewed: Preterm labor symptoms and general obstetric precautions including but not limited to vaginal bleeding, contractions, leaking of fluid and fetal movement were reviewed in detail with the patient.  All questions were answered.  Follow-up: Return in about 1 week (around 03/23/2024) for HROB visit with Lanett Lasorsa + BPP/NST.   Future Appointments  Date Time Provider Department Center  03/23/2024   1:30 PM [facility] NURSE [facility]  03/23/2024  1:50 PM Marilynn Nest, DO [facility]  03/30/2024  9:15 AM [facility] - [facility]YN US  [facility] None  03/30/2024 10:10 AM Marilynn Nest, DO [facility]  04/04/2024  2:45 PM [facility] PROVIDER 1 [facility]  04/04/2024  3:00 PM [facility] US1 [facility]  04/06/2024  1:50 PM Marilynn Nest, DO [facility]    Orders Placed This Encounter  Procedures   Culture, beta strep (group b only)    Robbin Escher, DO Attending Obstetrician & Gynecologist, [facility] [employer], [facility]

## 2024-03-17 ENCOUNTER — Ambulatory Visit: Payer: Self-pay | Admitting: Obstetrics & Gynecology

## 2024-03-17 LAB — CERVICOVAGINAL ANCILLARY ONLY
Chlamydia: NEGATIVE
Comment: NEGATIVE
Comment: NORMAL
Neisseria Gonorrhea: NEGATIVE

## 2024-03-20 LAB — CULTURE, BETA STREP (GROUP B ONLY)

## 2024-03-23 ENCOUNTER — Other Ambulatory Visit

## 2024-03-23 ENCOUNTER — Encounter: Payer: Self-pay | Admitting: Obstetrics & Gynecology

## 2024-03-23 ENCOUNTER — Ambulatory Visit: Admitting: Obstetrics & Gynecology

## 2024-03-23 VITALS — BP 127/84 | HR 98 | Wt 203.4 lb

## 2024-03-23 DIAGNOSIS — O0993 Supervision of high risk pregnancy, unspecified, third trimester: Secondary | ICD-10-CM | POA: Diagnosis not present

## 2024-03-23 DIAGNOSIS — Z3A37 37 weeks gestation of pregnancy: Secondary | ICD-10-CM

## 2024-03-23 DIAGNOSIS — O0990 Supervision of high risk pregnancy, unspecified, unspecified trimester: Secondary | ICD-10-CM

## 2024-03-23 DIAGNOSIS — K219 Gastro-esophageal reflux disease without esophagitis: Secondary | ICD-10-CM

## 2024-03-23 MED ORDER — FAMOTIDINE 20 MG PO TABS: 20.0000 mg | ORAL_TABLET | Freq: Two times a day (BID) | ORAL | 0 refills | Status: DC

## 2024-03-23 NOTE — Progress Notes (Signed)
 HIGH-RISK PREGNANCY VISIT Patient name: Tanya Gregory MRN [id number]  Date of birth: [birth year] Chief Complaint:   Routine Prenatal Visit  History of Present Illness:   Tanya Gregory is a 43 y.o. [id number] female at [redacted]w[redacted]d with an Estimated Date of Delivery: 04/11/24 being seen today for ongoing management of a high-risk pregnancy complicated by:  -TOLAC -AMA -h/o HSV- on suppression  Today she reports that she continues to have pelvic pressure and discomfort Notes discomfort with moving.  She also notes considerable swelling in her legs left more than right.  Denies headache or blurry vision.  She does not have a BP cuff at home..   Contractions: Irritability. Vag. Bleeding: None.  Movement: Present. denies leaking of fluid.      02/17/2024    1:55 PM 05/06/2016    3:42 PM  Depression screen PHQ 2/9  Decreased Interest 0 0  Down, Depressed, Hopeless 0 0  PHQ - 2 Score 0 0  Altered sleeping 0   Tired, decreased energy 0   Change in appetite 0   Feeling bad or failure about yourself  0   Trouble concentrating 0   Moving slowly or fidgety/restless 0   Suicidal thoughts 0   PHQ-9 Score 0      Current Outpatient Medications  Medication Instructions   acetaminophen  (TYLENOL ) 650 mg, Oral, Every 6 hours PRN   aspirin EC 81 mg, Daily   calcium  carbonate (TUMS - DOSED IN MG ELEMENTAL CALCIUM ) 500 MG chewable tablet 1-2 tablets, Daily PRN   famotidine  (PEPCID ) 20 mg, Oral, 2 times daily   hydrocortisone  cream 1 % 1 Application, Topical, 2 times daily   ibuprofen  (ADVIL ) 600 mg, Oral, Every 6 hours PRN   Prenatal Vit-Fe Fumarate-FA (PRENATAL MULTIVITAMIN) TABS tablet 1 tablet, Daily   valACYclovir  (VALTREX ) 500 mg, Oral, 2 times daily     Review of Systems:   Pertinent items are noted in HPI She has noted some white to clear discharge.  No vaginal itching or odor.  Denies headaches, visual changes, shortness of breath, chest pain, abdominal pain, severe  nausea/vomiting, or problems with urination or bowel movements unless otherwise stated above. Pertinent History Reviewed:  Reviewed past medical,surgical, social, obstetrical and family history.  Reviewed problem list, medications and allergies. Physical Assessment:   Vitals:   03/23/24 1328  BP: 127/84  Pulse: 98  Weight: 203 lb 6.4 oz (92.3 kg)  Body mass index is 33.85 kg/m.           Physical Examination:   General appearance: alert, well appearing, and in no distress  Mental status: normal mood, behavior, speech, dress, motor activity, and thought processes  Skin: warm & dry   Extremities:      Cardiovascular: normal heart rate noted  Respiratory: normal respiratory effort, no distress  Abdomen: gravid, soft, non-tender  Pelvic: Cervical exam performed  Dilation: 1 Effacement (%): 20 Station: -3  Fetal Status:     Movement: Present    Fetal Surveillance Testing today: NST  NST being performed due to [facility]   Fetal Monitoring:  Baseline: 140 bpm, Variability: moderate, Accelerations: present, The accelerations are >15 bpm and more than 2 in 20 minutes, and Decelerations: Absent     Final diagnosis:   Reactive NST      Chaperone: N/A    No results found for this or any previous visit (from the past 24 hours).   Assessment & Plan:  High-risk pregnancy: [id number] at [redacted]w[redacted]d with an Estimated  Date of Delivery: 04/11/24   1) AMA Reactive NST, continue antepartum testing Scheduled for IOL at 39 weeks 2) TOLAC  3) h/o HSV Asymptomatic on suppression  Adding Pepcid  due to considerable acid reflux  Meds:  Meds ordered this encounter  Medications   famotidine  (PEPCID ) 20 MG tablet    Sig: Take 1 tablet (20 mg total) by mouth 2 (two) times daily.    Dispense:  60 tablet    Refill:  0    Labs/procedures today: NST  Treatment Plan: Routine OB care and as outlined above  Reviewed: Term labor symptoms and general obstetric precautions including but not limited to  vaginal bleeding, contractions, leaking of fluid and fetal movement were reviewed in detail with the patient.  All questions were answered.  Follow-up: Return for as scheduled.   Future Appointments  Date Time Provider Department Center  03/30/2024  9:15 AM [facility] - [facility]TO[facility]YN US  [facility] None  03/30/2024 10:10 AM Marilynn Nest, DO [facility]  04/04/2024  6:30 AM [facility] SCHED ROOM [facility]INDC None  04/04/2024  2:45 PM [facility] PROVIDER 1 [facility]  04/04/2024  3:00 PM [facility] US1 [facility]  04/06/2024  1:50 PM Marilynn Nest, DO [facility]    No orders of the defined types were placed in this encounter.   Alassane Kalafut, DO Attending Obstetrician & Gynecologist, [facility] for [employer], [facility]

## 2024-03-25 ENCOUNTER — Inpatient Hospital Stay (HOSPITAL_COMMUNITY)
Admission: AD | Admit: 2024-03-25 | Discharge: 2024-03-27 | DRG: 806 | Disposition: A | Discharge status: Home / Self Care | Attending: Obstetrics and Gynecology | Admitting: Obstetrics and Gynecology

## 2024-03-25 ENCOUNTER — Encounter (HOSPITAL_COMMUNITY): Payer: Self-pay | Admitting: Obstetrics and Gynecology

## 2024-03-25 ENCOUNTER — Other Ambulatory Visit: Payer: Self-pay

## 2024-03-25 DIAGNOSIS — Z7982 Long term (current) use of aspirin: Secondary | ICD-10-CM | POA: Diagnosis not present

## 2024-03-25 DIAGNOSIS — Z8249 Family history of ischemic heart disease and other diseases of the circulatory system: Secondary | ICD-10-CM

## 2024-03-25 DIAGNOSIS — O9832 Other infections with a predominantly sexual mode of transmission complicating childbirth: Secondary | ICD-10-CM | POA: Diagnosis present

## 2024-03-25 DIAGNOSIS — R030 Elevated blood-pressure reading, without diagnosis of hypertension: Secondary | ICD-10-CM | POA: Diagnosis present

## 2024-03-25 DIAGNOSIS — O365930 Maternal care for other known or suspected poor fetal growth, third trimester, not applicable or unspecified: Secondary | ICD-10-CM | POA: Diagnosis present

## 2024-03-25 DIAGNOSIS — Z3A37 37 weeks gestation of pregnancy: Secondary | ICD-10-CM | POA: Diagnosis not present

## 2024-03-25 DIAGNOSIS — O134 Gestational [pregnancy-induced] hypertension without significant proteinuria, complicating childbirth: Principal | ICD-10-CM | POA: Diagnosis present

## 2024-03-25 DIAGNOSIS — O09523 Supervision of elderly multigravida, third trimester: Secondary | ICD-10-CM | POA: Diagnosis not present

## 2024-03-25 DIAGNOSIS — O34219 Maternal care for unspecified type scar from previous cesarean delivery: Secondary | ICD-10-CM | POA: Diagnosis not present

## 2024-03-25 DIAGNOSIS — O34211 Maternal care for low transverse scar from previous cesarean delivery: Secondary | ICD-10-CM | POA: Diagnosis present

## 2024-03-25 DIAGNOSIS — A6000 Herpesviral infection of urogenital system, unspecified: Secondary | ICD-10-CM | POA: Diagnosis present

## 2024-03-25 DIAGNOSIS — Z8759 Personal history of other complications of pregnancy, childbirth and the puerperium: Secondary | ICD-10-CM

## 2024-03-25 DIAGNOSIS — O139 Gestational [pregnancy-induced] hypertension without significant proteinuria, unspecified trimester: Principal | ICD-10-CM | POA: Diagnosis present

## 2024-03-25 DIAGNOSIS — M7989 Other specified soft tissue disorders: Secondary | ICD-10-CM | POA: Diagnosis not present

## 2024-03-25 LAB — COMPREHENSIVE METABOLIC PANEL WITH GFR
ALT: 13 U/L (ref 0–44)
AST: 19 U/L (ref 15–41)
Albumin: 2.7 g/dL — ABNORMAL LOW (ref 3.5–5.0)
Alkaline Phosphatase: 426 U/L — ABNORMAL HIGH (ref 38–126)
Anion gap: 12 (ref 5–15)
BUN: 9 mg/dL (ref 6–20)
CO2: 21 mmol/L — ABNORMAL LOW (ref 22–32)
Calcium: 9.1 mg/dL (ref 8.9–10.3)
Chloride: 104 mmol/L (ref 98–111)
Creatinine, Ser: 0.87 mg/dL (ref 0.44–1.00)
GFR, Estimated: >60 mL/min (ref >60)
Glucose, Bld: 97 mg/dL (ref 70–99)
Potassium: 3.7 mmol/L (ref 3.5–5.1)
Sodium: 137 mmol/L (ref 135–145)
Total Bilirubin: 0.5 mg/dL (ref 0.0–1.2)
Total Protein: 6.4 g/dL — ABNORMAL LOW (ref 6.5–8.1)

## 2024-03-25 LAB — CBC
HCT: 36.1 % (ref 36.0–46.0)
HCT: 36.4 % (ref 36.0–46.0)
Hemoglobin: 12.9 g/dL (ref 12.0–15.0)
Hemoglobin: 12.9 g/dL (ref 12.0–15.0)
MCH: 32.9 pg (ref 26.0–34.0)
MCH: 32.7 pg (ref 26.0–34.0)
MCHC: 35.7 g/dL (ref 30.0–36.0)
MCHC: 35.4 g/dL (ref 30.0–36.0)
MCV: 92.1 fL (ref 80.0–100.0)
MCV: 92.4 fL (ref 80.0–100.0)
Platelets: 244 K/uL (ref 150–400)
Platelets: 253 K/uL (ref 150–400)
RBC: 3.92 MIL/uL (ref 3.87–5.11)
RBC: 3.94 MIL/uL (ref 3.87–5.11)
RDW: 14 % (ref 11.5–15.5)
RDW: 13.9 % (ref 11.5–15.5)
WBC: 11.3 K/uL — ABNORMAL HIGH (ref 4.0–10.5)
WBC: 12.2 K/uL — ABNORMAL HIGH (ref 4.0–10.5)
nRBC: 0 % (ref 0.0–0.2)
nRBC: 0 % (ref 0.0–0.2)

## 2024-03-25 LAB — PROTEIN / CREATININE RATIO, URINE
Creatinine, Urine: 235 mg/dL
Protein Creatinine Ratio: 0.06 mg/mg{creat} (ref 0.00–0.15)
Total Protein, Urine: 13 mg/dL

## 2024-03-25 LAB — TYPE AND SCREEN
ABO/RH(D): A POS
Antibody Screen: NEGATIVE

## 2024-03-25 MED ORDER — OXYTOCIN-SODIUM CHLORIDE 30-0.9 UT/500ML-% IV SOLN
2.5000 [IU]/h | INTRAVENOUS | Status: DC
  Administered 2024-03-26: 2.5 [IU]/h via INTRAVENOUS

## 2024-03-25 MED ORDER — HYDROXYZINE HCL 50 MG PO TABS: 50.0000 mg | ORAL_TABLET | Freq: Four times a day (QID) | ORAL | Status: DC | PRN

## 2024-03-25 MED ORDER — SOD CITRATE-CITRIC ACID 500-334 MG/5ML PO SOLN: 30.0000 mL | ORAL | Status: DC | PRN

## 2024-03-25 MED ORDER — OXYCODONE-ACETAMINOPHEN 5-325 MG PO TABS: 1.0000 | ORAL_TABLET | ORAL | Status: DC | PRN

## 2024-03-25 MED ORDER — FENTANYL CITRATE (PF) 100 MCG/2ML IJ SOLN: 50.0000 ug | INTRAMUSCULAR | Status: DC | PRN

## 2024-03-25 MED ORDER — ACETAMINOPHEN 325 MG PO TABS: 650.0000 mg | ORAL_TABLET | ORAL | Status: DC | PRN

## 2024-03-25 MED ORDER — LACTATED RINGERS IV SOLN: 500.0000 mL | INTRAVENOUS | Status: DC | PRN

## 2024-03-25 MED ORDER — OXYTOCIN BOLUS FROM INFUSION
333.0000 mL | Freq: Once | INTRAVENOUS | Status: AC
  Administered 2024-03-26: 333 mL via INTRAVENOUS

## 2024-03-25 MED ORDER — TERBUTALINE SULFATE 1 MG/ML IJ SOLN: 0.2500 mg | Freq: Once | INTRAMUSCULAR | Status: DC | PRN

## 2024-03-25 MED ORDER — LIDOCAINE HCL (PF) 1 % IJ SOLN: 30.0000 mL | INTRAMUSCULAR | Status: DC | PRN

## 2024-03-25 MED ORDER — ONDANSETRON HCL 4 MG/2ML IJ SOLN: 4.0000 mg | Freq: Four times a day (QID) | INTRAMUSCULAR | Status: DC | PRN

## 2024-03-25 MED ORDER — SODIUM CHLORIDE 0.9% FLUSH: 3.0000 mL | Freq: Two times a day (BID) | INTRAVENOUS | Status: DC

## 2024-03-25 MED ORDER — OXYTOCIN-SODIUM CHLORIDE 30-0.9 UT/500ML-% IV SOLN
1.0000 m[IU]/min | INTRAVENOUS | Status: DC
  Administered 2024-03-25: 2 m[IU]/min via INTRAVENOUS
  Filled 2024-03-25: qty 500

## 2024-03-25 MED ORDER — FAMOTIDINE 20 MG PO TABS
20.0000 mg | ORAL_TABLET | Freq: Once | ORAL | Status: AC
  Administered 2024-03-25: 20 mg via ORAL
  Filled 2024-03-25: qty 1

## 2024-03-25 MED ORDER — SODIUM CHLORIDE 0.9% FLUSH: 3.0000 mL | INTRAVENOUS | Status: DC | PRN

## 2024-03-25 MED ORDER — LACTATED RINGERS IV SOLN: INTRAVENOUS | Status: DC

## 2024-03-25 MED ORDER — SODIUM CHLORIDE 0.9 % IV SOLN: 250.0000 mL | INTRAVENOUS | Status: DC | PRN

## 2024-03-25 MED ORDER — OXYCODONE-ACETAMINOPHEN 5-325 MG PO TABS: 2.0000 | ORAL_TABLET | ORAL | Status: DC | PRN

## 2024-03-25 NOTE — MAU Note (Deleted)
 Tanya Gregory is a 43 y.o. at [redacted]w[redacted]d here in MAU report  Onset of complaint: today Pain score:  Vitals:   03/25/24 1537  BP: (!) 147/85  Pulse: (!) 109  Resp: 18  Temp: 98.7 F (37.1 C)

## 2024-03-25 NOTE — H&P (Signed)
 OBSTETRIC ADMISSION HISTORY AND PHYSICAL  Tanya Gregory is a 43 y.o. female [phone] with IUP at [redacted]w[redacted]d by LMP c/w 6wk US  presenting for labor check and noted to have elevated blood pressures. She also notes BLE edema and a mild headache. She reports +FMs, No LOF, no VB, no blurry vision, and RUQ pain.  She plans on breast feeding. She request natural planning for birth control. She received her prenatal care at [facility]   Dating: By LMP c/w 6wk US  --->  Estimated Date of Delivery: 04/11/24  Sono:    @[redacted]w[redacted]d , CWD, normal anatomy, cephalic presentation, 2648g, 67% EFW   Prenatal History/Complications: AMA, h/o HSV, h/o pre-eclampsia, hx LTCS  Past Medical History: Past Medical History:  Diagnosis Date   History of pre-eclampsia    HSV-2 (herpes simplex virus 2) infection    Nephrolithiasis 2015   Placenta previa    Vaginal Pap smear, abnormal     Past Surgical History: Past Surgical History:  Procedure Laterality Date   CESAREAN SECTION N/A 02/27/2019   Procedure: CESAREAN SECTION;  Surgeon: Ozan, Jennifer, DO;  Location: [facility] LD ORS;  Service: Obstetrics;  Laterality: N/A;   DIAGNOSTIC LAPAROSCOPY WITH REMOVAL OF ECTOPIC PREGNANCY  08/30/2016   Procedure: DIAGNOSTIC LAPAROSCOPYLEFT SALPINGECTOMY WITH REMOVAL OF ECTOPIC PREGNANCY;  Surgeon: Delon Prude, DO;  Location: [facility] ORS;  Service: Gynecology;;   LEEP  age 29    Obstetrical History: OB History     Gravida  8   Para  4   Term  3   Preterm  1   AB  3   Living  3      SAB  1   IAB  1   Ectopic  1   Multiple  0   Live Births  3        Obstetric Comments  1st SAB at 4 months         Social History Social History   Socioeconomic History   Marital status: Single    Spouse name: Not on file   Number of children: 1   Years of education: Not on file   Highest education level: Not on file  Occupational History   Occupation: runner, broadcasting/film/video  Tobacco Use   Smoking status: Never   Smokeless tobacco:  Never  Vaping Use   Vaping status: Never Used  Substance and Sexual Activity   Alcohol use: Not Currently    Comment: every now and then   Drug use: No   Sexual activity: Yes    Birth control/protection: None  Other Topics Concern   Not on file  Social History Narrative   Lives her daughter Julie).   Smoke detectors in home? Yes    Guns in home? No    Consistent seatbelt use? Yes    Consistent dental brushing and flossing? Yes    Semi-Annual dental visits: Yes    Annual Eye exams: No    Social Drivers of Health   Financial Resource Strain: Low Risk  (02/17/2024)   Overall Financial Resource Strain (CARDIA)    Difficulty of Paying Living Expenses: Not hard at all  Food Insecurity: No Food Insecurity (02/17/2024)   Hunger Vital Sign    Worried About Running Out of Food in the Last Year: Never true    Ran Out of Food in the Last Year: Never true  Transportation Needs: No Transportation Needs (02/17/2024)   PRAPARE - Transportation    Lack of Transportation (Medical): No    Lack  of Transportation (Non-Medical): No  Physical Activity: Insufficiently Active (02/17/2024)   Exercise Vital Sign    Days of Exercise per Week: 1 day    Minutes of Exercise per Session: 10 min  Stress: No Stress Concern Present (02/17/2024)   [employer] of Occupational Health - Occupational Stress Questionnaire    Feeling of Stress: Not at all  Social Connections: Socially Integrated (02/17/2024)   Social Connection and Isolation Panel    Frequency of Communication with Friends and Family: Twice a week    Frequency of Social Gatherings with Friends and Family: Once a week    Attends Religious Services: More than 4 times per year    Active Member of [employer] or Organizations: Yes    Attends Engineer, Structural: More than 4 times per year    Marital Status: Married    Family History: Family History  Problem Relation Age of Onset   Hypertension Mother    COPD Maternal Grandmother         colon   Stroke Paternal Grandmother    Heart attack Paternal Grandmother     Allergies: No Known Allergies  Medications Prior to Admission  Medication Sig Dispense Refill Last Dose/Taking   aspirin EC 81 MG tablet Take 81 mg by mouth daily. Swallow whole.   03/24/2024   calcium  carbonate (TUMS - DOSED IN MG ELEMENTAL CALCIUM ) 500 MG chewable tablet Chew 1-2 tablets by mouth daily as needed for indigestion or heartburn.   03/24/2024   Prenatal Vit-Fe Fumarate-FA (PRENATAL MULTIVITAMIN) TABS tablet Take 1 tablet by mouth daily at 12 noon.   03/25/2024   valACYclovir  (VALTREX ) 500 MG tablet Take 1 tablet (500 mg total) by mouth 2 (two) times daily. 60 tablet 2 03/25/2024   acetaminophen  (TYLENOL ) 325 MG tablet Take 2 tablets (650 mg total) by mouth every 6 (six) hours as needed for mild pain or moderate pain (temperature > 101.5.). (Patient not taking: Reported on 03/23/2024) 60 tablet 1    famotidine  (PEPCID ) 20 MG tablet Take 1 tablet (20 mg total) by mouth 2 (two) times daily. 60 tablet 0 Unknown   hydrocortisone  cream 1 % Apply 1 Application topically 2 (two) times daily. (Patient not taking: Reported on 03/23/2024) 30 g 0    ibuprofen  (ADVIL ) 600 MG tablet Take 1 tablet (600 mg total) by mouth every 6 (six) hours as needed. (Patient not taking: Reported on 03/23/2024) 60 tablet 0      Review of Systems   All systems reviewed and negative except as stated in HPI  Blood pressure 129/83, pulse (!) 102, temperature 98.7 F (37.1 C), temperature source Oral, resp. rate 18, height 5' 5 (1.651 m), weight 91.6 kg, last menstrual period 07/06/2023, SpO2 100%, unknown if currently breastfeeding. General appearance: alert, cooperative, appears stated age, and no distress Lungs: clear to auscultation bilaterally Heart: regular rate and rhythm Abdomen: soft, non-tender; bowel sounds normal Pelvic: Speculum exam to be completed when patient on L&D Extremities: Homans sign is negative  bilaterally, swelling L>R with TTP in L foot Presentation: cephalic Fetal monitoringBaseline: 130 bpm, Variability: Good {> 6 bpm), Accelerations: Reactive, and Decelerations: Absent Uterine activityFrequency: Every 3-5 minutes Dilation: 1 Effacement (%): 20 Station: Ballotable Exam by:: Asberry Keys, RN   Prenatal labs: ABO, Rh:   Antibody:   Rubella: Immune (04/10 0000) RPR: Nonreactive (08/26 0000)  HBsAg: Negative (04/10 0000)  HIV: Non-reactive (04/10 0000)  GBS: Negative/-- (10/23 1501)    Lab Results  Component Value Date  GBS Negative 03/16/2024   GTT normal Genetic screening  normal Anatomy US  normal  There is no immunization history for the selected administration types on file for this patient.  Prenatal Transfer Tool  Maternal Diabetes: No Genetic Screening: Normal Maternal Ultrasounds/Referrals: Normal Fetal Ultrasounds or other Referrals:  None Maternal Substance Abuse:  No Significant Maternal Medications:  None Significant Maternal Lab Results: Group B Strep negative Number of Prenatal Visits:greater than 3 verified prenatal visits Maternal Vaccinations:None Other Comments:  None   Results for orders placed or performed during the hospital encounter of 03/25/24 (from the past 24 hours)  CBC   Collection Time: 03/25/24  4:34 PM  Result Value Ref Range   WBC 11.3 (H) 4.0 - 10.5 K/uL   RBC 3.92 3.87 - 5.11 MIL/uL   Hemoglobin 12.9 12.0 - 15.0 g/dL   HCT 63.8 63.9 - 53.9 %   MCV 92.1 80.0 - 100.0 fL   MCH 32.9 26.0 - 34.0 pg   MCHC 35.7 30.0 - 36.0 g/dL   RDW 85.9 88.4 - 84.4 %   Platelets 244 150 - 400 K/uL   nRBC 0.0 0.0 - 0.2 %  Comprehensive metabolic panel with GFR   Collection Time: 03/25/24  4:34 PM  Result Value Ref Range   Sodium 137 135 - 145 mmol/L   Potassium 3.7 3.5 - 5.1 mmol/L   Chloride 104 98 - 111 mmol/L   CO2 21 (L) 22 - 32 mmol/L   Glucose, Bld 97 70 - 99 mg/dL   BUN 9 6 - 20 mg/dL   Creatinine, Ser 9.12 0.44 - 1.00  mg/dL   Calcium  9.1 8.9 - 10.3 mg/dL   Total Protein 6.4 (L) 6.5 - 8.1 g/dL   Albumin 2.7 (L) 3.5 - 5.0 g/dL   AST 19 15 - 41 U/L   ALT 13 0 - 44 U/L   Alkaline Phosphatase 426 (H) 38 - 126 U/L   Total Bilirubin 0.5 0.0 - 1.2 mg/dL   GFR, Estimated >39 >39 mL/min   Anion gap 12 5 - 15  Protein / creatinine ratio, urine   Collection Time: 03/25/24  4:59 PM  Result Value Ref Range   Creatinine, Urine 235 mg/dL   Total Protein, Urine 13 mg/dL   Protein Creatinine Ratio 0.06 0.00 - 0.15 mg/mg[Cre]    Patient Active Problem List   Diagnosis Date Noted   Supervision of high risk pregnancy, antepartum 02/16/2024   History of cesarean section complicating pregnancy 11/30/2023   History of pre-eclampsia in prior pregnancy, currently pregnant in second trimester 11/30/2023   Hx of LEEP (loop electrosurgical excision procedure) of cervix complicating pregnancy, second trimester 11/30/2023   History of preterm premature rupture of membranes (PPROM) 11/30/2023   Pregnancy with history of ectopic pregnancy, antepartum, second trimester 11/30/2023   Advanced maternal age in multigravida, second trimester 11/30/2023   Pregnancy affected by fetal growth restriction 11/30/2023   HSV-2 infection complicating pregnancy, second trimester 11/30/2023    Assessment/Plan:  Tanya Gregory is a 43 y.o. H1E6866 at [redacted]w[redacted]d here for IOL for gHTN  #Labor: IOL #Pain: Per patient preference #FWB: Category I #GBS status:  negative #Feeding: Breastmilk  #Reproductive Life planning: Natural Family Planning #Circ:  yes  #gHTN:mild range pressures noted in MAU, PEC labs negative #HSV: Speculum exam to be completed on arrival to L&D  Charlie DELENA Courts, MD  03/25/2024, 6:03 PM

## 2024-03-25 NOTE — Progress Notes (Signed)
 Labor Progress Note Tanya Gregory is a 43 y.o. H1E6866 at [redacted]w[redacted]d presented for IOL due to newly diagnosed gHTN.   S: Doing well.  O:  BP 130/83   Pulse 92   Temp 98.7 F (37.1 C) (Oral)   Resp 18   Ht 5' 5 (1.651 m)   Wt 91.6 kg   LMP 07/06/2023   SpO2 99%   BMI 33.60 kg/m  EFM: 125/Moderate Variability/Accelerations (+),Decelerations (-)  [name]: Dilation: 1 Effacement (%): 20 Cervical Position: Posterior Station: Ballotable Exam by:: Asberry Keys, RN   A&P: 43 y.o. [id number] [redacted]w[redacted]d  #Labor: Progressing well. Speculum exam performed and no lesions noted. FB placed during speculum exam with ringed forceps. Will start pitocin  2x2.  #Pain: Per patient request #FWB: Category I #GBS negative #gHTN: Continue to monitor BP's. PIH labs negative. Consider lasix/Kcl postpartum  Redith Drach [name], MD 10:54 PM

## 2024-03-25 NOTE — MAU Note (Signed)
 Tanya Gregory is a 43 y.o. at [redacted]w[redacted]d here in MAU reporting: contractions every 10 minutes that started at 1100 today and scant dark red vaginal discharge. Patient reports having her membranes stripped on Thursday 03/23/2024. Patient denies having LOF. Patient does feel fetal movement.   EDC:  04/11/2024 Onset of complaint: today at 1100 Pain score: 1 Vitals:   03/25/24 1537  BP: (!) 147/85  Pulse: (!) 109  Resp: 18  Temp: 98.7 F (37.1 C)     FHT: 130

## 2024-03-26 ENCOUNTER — Inpatient Hospital Stay (HOSPITAL_COMMUNITY): Admitting: Anesthesiology

## 2024-03-26 ENCOUNTER — Encounter (HOSPITAL_COMMUNITY): Payer: Self-pay | Admitting: Obstetrics and Gynecology

## 2024-03-26 DIAGNOSIS — O134 Gestational [pregnancy-induced] hypertension without significant proteinuria, complicating childbirth: Secondary | ICD-10-CM

## 2024-03-26 DIAGNOSIS — O34211 Maternal care for low transverse scar from previous cesarean delivery: Secondary | ICD-10-CM

## 2024-03-26 DIAGNOSIS — O09523 Supervision of elderly multigravida, third trimester: Secondary | ICD-10-CM

## 2024-03-26 DIAGNOSIS — Z3A37 37 weeks gestation of pregnancy: Secondary | ICD-10-CM

## 2024-03-26 LAB — CBC
HCT: 37.2 % (ref 36.0–46.0)
Hemoglobin: 13.3 g/dL (ref 12.0–15.0)
MCH: 33.1 pg (ref 26.0–34.0)
MCHC: 35.8 g/dL (ref 30.0–36.0)
MCV: 92.5 fL (ref 80.0–100.0)
Platelets: 270 K/uL (ref 150–400)
RBC: 4.02 MIL/uL (ref 3.87–5.11)
RDW: 13.9 % (ref 11.5–15.5)
WBC: 19.1 K/uL — ABNORMAL HIGH (ref 4.0–10.5)
nRBC: 0 % (ref 0.0–0.2)

## 2024-03-26 LAB — RPR: RPR Ser Ql: NONREACTIVE

## 2024-03-26 MED ORDER — FENTANYL-BUPIVACAINE-NACL 0.5-0.125-0.9 MG/250ML-% EP SOLN
12.0000 mL/h | EPIDURAL | Status: DC | PRN
  Administered 2024-03-26: 12 mL/h via EPIDURAL
  Filled 2024-03-26: qty 250

## 2024-03-26 MED ORDER — IBUPROFEN 600 MG PO TABS
600.0000 mg | ORAL_TABLET | Freq: Four times a day (QID) | ORAL | Status: DC
  Administered 2024-03-26 – 2024-03-27 (×5): 600 mg via ORAL
  Filled 2024-03-26 (×5): qty 1

## 2024-03-26 MED ORDER — ACETAMINOPHEN 325 MG PO TABS: 650.0000 mg | ORAL_TABLET | ORAL | Status: DC | PRN

## 2024-03-26 MED ORDER — LACTATED RINGERS IV SOLN: 500.0000 mL | Freq: Once | INTRAVENOUS | Status: DC

## 2024-03-26 MED ORDER — PRENATAL MULTIVITAMIN CH
1.0000 | ORAL_TABLET | Freq: Every day | ORAL | Status: DC
  Administered 2024-03-26 – 2024-03-27 (×2): 1 via ORAL
  Filled 2024-03-26 (×2): qty 1

## 2024-03-26 MED ORDER — OXYCODONE HCL 5 MG PO TABS: 5.0000 mg | ORAL_TABLET | ORAL | Status: DC | PRN

## 2024-03-26 MED ORDER — ZOLPIDEM TARTRATE 5 MG PO TABS: 5.0000 mg | ORAL_TABLET | Freq: Every evening | ORAL | Status: DC | PRN

## 2024-03-26 MED ORDER — FUROSEMIDE 20 MG PO TABS
10.0000 mg | ORAL_TABLET | Freq: Every day | ORAL | Status: DC
  Administered 2024-03-26 – 2024-03-27 (×2): 10 mg via ORAL
  Filled 2024-03-26: qty 1
  Filled 2024-03-26: qty 0.5

## 2024-03-26 MED ORDER — DIPHENHYDRAMINE HCL 25 MG PO CAPS: 25.0000 mg | ORAL_CAPSULE | Freq: Four times a day (QID) | ORAL | Status: DC | PRN

## 2024-03-26 MED ORDER — BENZOCAINE-MENTHOL 20-0.5 % EX AERO: 1.0000 | INHALATION_SPRAY | CUTANEOUS | Status: DC | PRN

## 2024-03-26 MED ORDER — DIBUCAINE (PERIANAL) 1 % EX OINT: 1.0000 | TOPICAL_OINTMENT | CUTANEOUS | Status: DC | PRN

## 2024-03-26 MED ORDER — TETANUS-DIPHTH-ACELL PERTUSSIS 5-2-15.5 LF-MCG/0.5 IM SUSP: 0.5000 mL | Freq: Once | INTRAMUSCULAR | Status: DC

## 2024-03-26 MED ORDER — ONDANSETRON HCL 4 MG/2ML IJ SOLN: 4.0000 mg | INTRAMUSCULAR | Status: DC | PRN

## 2024-03-26 MED ORDER — COCONUT OIL OIL: 1.0000 | TOPICAL_OIL | Status: DC | PRN

## 2024-03-26 MED ORDER — EPHEDRINE 5 MG/ML INJ: 10.0000 mg | INTRAVENOUS | Status: DC | PRN

## 2024-03-26 MED ORDER — WITCH HAZEL-GLYCERIN EX PADS: 1.0000 | MEDICATED_PAD | CUTANEOUS | Status: DC | PRN

## 2024-03-26 MED ORDER — BUPIVACAINE HCL (PF) 0.25 % IJ SOLN
INTRAMUSCULAR | Status: DC | PRN
  Administered 2024-03-26: 1.4 mL via INTRATHECAL

## 2024-03-26 MED ORDER — POTASSIUM CHLORIDE 20 MEQ PO PACK
20.0000 meq | PACK | Freq: Two times a day (BID) | ORAL | Status: DC
  Administered 2024-03-26 – 2024-03-27 (×3): 20 meq via ORAL
  Filled 2024-03-26 (×3): qty 1

## 2024-03-26 MED ORDER — SENNOSIDES-DOCUSATE SODIUM 8.6-50 MG PO TABS
2.0000 | ORAL_TABLET | Freq: Every day | ORAL | Status: DC
  Filled 2024-03-26: qty 2

## 2024-03-26 MED ORDER — ONDANSETRON HCL 4 MG PO TABS: 4.0000 mg | ORAL_TABLET | ORAL | Status: DC | PRN

## 2024-03-26 MED ORDER — OXYCODONE HCL 5 MG PO TABS: 10.0000 mg | ORAL_TABLET | ORAL | Status: DC | PRN

## 2024-03-26 MED ORDER — SIMETHICONE 80 MG PO CHEW: 80.0000 mg | CHEWABLE_TABLET | ORAL | Status: DC | PRN

## 2024-03-26 MED ORDER — PHENYLEPHRINE 80 MCG/ML (10ML) SYRINGE FOR IV PUSH (FOR BLOOD PRESSURE SUPPORT): 80.0000 ug | PREFILLED_SYRINGE | INTRAVENOUS | Status: DC | PRN

## 2024-03-26 MED ORDER — DIPHENHYDRAMINE HCL 50 MG/ML IJ SOLN: 12.5000 mg | INTRAMUSCULAR | Status: DC | PRN

## 2024-03-26 NOTE — Anesthesia Preprocedure Evaluation (Signed)
 Anesthesia Evaluation  Patient identified by MRN, date of birth, ID band Patient awake    Reviewed: Allergy & Precautions, Patient's Chart, lab work & pertinent test results  Airway Mallampati: II  TM Distance: >3 FB Neck ROM: Full    Dental no notable dental hx.    Pulmonary neg pulmonary ROS   Pulmonary exam normal breath sounds clear to auscultation       Cardiovascular hypertension, Normal cardiovascular exam Rhythm:Regular Rate:Normal     Neuro/Psych negative neurological ROS  negative psych ROS   GI/Hepatic negative GI ROS, Neg liver ROS,,,  Endo/Other  BMI 34  Renal/GU negative Renal ROS  negative genitourinary   Musculoskeletal negative musculoskeletal ROS (+)    Abdominal   Peds negative pediatric ROS (+)  Hematology negative hematology ROS (+)   Anesthesia Other Findings   Reproductive/Obstetrics (+) Pregnancy Hx 2 vaginal deliveries and then c section 2020 for previa, now here for TOLAC                               Anesthesia Physical Anesthesia Plan  ASA: 2  Anesthesia Plan: Combined Spinal and Epidural   Post-op Pain Management:    Induction:   PONV Risk Score and Plan: 2  Airway Management Planned: Natural Airway  Additional Equipment: None  Intra-op Plan:   Post-operative Plan:   Informed Consent: I have reviewed the patients History and Physical, chart, labs and discussed the procedure including the risks, benefits and alternatives for the proposed anesthesia with the patient or authorized representative who has indicated his/her understanding and acceptance.       Plan Discussed with:   Anesthesia Plan Comments: (Advanced cervical dilation upon epidural request, will CSE)        Anesthesia Quick Evaluation

## 2024-03-26 NOTE — Lactation Note (Addendum)
 This note was copied from a baby's chart. Lactation Consultation Note  Patient Name: Tanya Gregory Unijb'd Date: 03/26/2024 Age:41 hours Reason for consult: Initial assessment;Early term 37-38.6wks  P4 experienced BF mom stated baby has been BF well. Mom denies painful latch. Newborn feeding habits, behavior, STS, I&O, body alignment reviewed. Mom encouraged to feed baby 8-12 times/24 hours and with feeding cues.  Mom has no questions or concerns at this time. Encouraged mom to call for assistance as needed.  Maternal Data Does the patient have breastfeeding experience prior to this delivery?: Yes How long did the patient breastfeed?: 1st-43yrs old for 78 months, 2nd-43yrs old for 65 months, 3rd-43 yrs old for 2 yrs  Feeding    LATCH Score Latch: Grasps breast easily, tongue down, lips flanged, rhythmical sucking.  Audible Swallowing: A few with stimulation  Type of Nipple: Everted at rest and after stimulation  Comfort (Breast/Nipple): Soft / non-tender  Hold (Positioning): No assistance needed to correctly position infant at breast.  LATCH Score: 9   Lactation Tools Discussed/Used    Interventions Interventions: Breast feeding basics reviewed;Skin to skin;Education;LC Services brochure  Discharge    Consult Status Consult Status: Follow-up Date: 03/27/24 Follow-up type: In-patient    Hye Trawick G 03/26/2024, 11:13 PM

## 2024-03-26 NOTE — Anesthesia Procedure Notes (Deleted)
 Epidural Patient location during procedure: OB Start time: 03/26/2024 4:58 AM End time: 03/26/2024 5:09 AM  Staffing Anesthesiologist: Merla Almarie HERO, DO Performed: anesthesiologist   Preanesthetic Checklist Completed: patient identified, IV checked, risks and benefits discussed, monitors and equipment checked, pre-op evaluation and timeout performed  Epidural Patient position: sitting Prep: DuraPrep and site prepped and draped Patient monitoring: continuous pulse ox, blood pressure, heart rate and cardiac monitor Approach: midline Location: L3-L4 Injection technique: LOR air  Needle:  Needle type: Tuohy  Needle gauge: 17 G Needle length: 9 cm Catheter type: closed end flexible Catheter size: 19 Gauge Test dose: negative  Assessment Sensory level: T8 Events: blood not aspirated, no cerebrospinal fluid, injection not painful, no injection resistance, no paresthesia and negative IV test  Additional Notes Patient identified. Risks/Benefits/Options discussed with patient including but not limited to bleeding, infection, nerve damage, paralysis, failed block, incomplete pain control, headache, blood pressure changes, nausea, vomiting, reactions to medication both or allergic, itching and postpartum back pain. Confirmed with bedside nurse the patient's most recent platelet count. Confirmed with patient that they are not currently taking any anticoagulation, have any bleeding history or any family history of bleeding disorders. Patient expressed understanding and wished to proceed. All questions were answered. Sterile technique was used throughout the entire procedure. Please see nursing notes for vital signs. Test dose was given through epidural catheter and negative prior to continuing to dose epidural or start infusion. Warning signs of high block given to the patient including shortness of breath, tingling/numbness in hands, complete motor block, or any concerning symptoms with  instructions to call for help. Patient was given instructions on fall risk and not to get out of bed. All questions and concerns addressed with instructions to call with any issues or inadequate analgesia.  Reason for block:procedure for pain

## 2024-03-26 NOTE — Anesthesia Postprocedure Evaluation (Signed)
 Anesthesia Post Note  Patient: Tanya Gregory  Procedure(s) Performed: AN AD HOC LABOR EPIDURAL     Patient location during evaluation: Mother Baby Anesthesia Type: Epidural Level of consciousness: awake Pain management: satisfactory to patient Vital Signs Assessment: post-procedure vital signs reviewed and stable Respiratory status: spontaneous breathing Cardiovascular status: stable Anesthetic complications: no   No notable events documented.  Last Vitals:  Vitals:   03/26/24 1034 03/26/24 1409  BP: 127/85 124/81  Pulse: 97 92  Resp: 18 18  Temp: 37 C 36.8 C  SpO2: 100% 99%    Last Pain:  Vitals:   03/26/24 1410  TempSrc:   PainSc: 0-No pain   Pain Goal:                Epidural/Spinal Function Cutaneous sensation: Normal sensation (03/26/24 1410), Patient able to flex knees: Yes (03/26/24 1410), Patient able to lift hips off bed: Yes (03/26/24 1410), Back pain beyond tenderness at insertion site: No (03/26/24 1410), Progressively worsening motor and/or sensory loss: No (03/26/24 1410), Bowel and/or bladder incontinence post epidural: No (03/26/24 1410)  JEANENNE HANDING

## 2024-03-26 NOTE — Discharge Summary (Signed)
 Postpartum Discharge Summary  Date of Service updated***     Patient Name: Tanya Gregory DOB: 1980/08/23 MRN: [id number]  Date of admission: 03/25/2024 Delivery date:03/26/2024 Delivering provider: PAYNE, SHANTONETTE M Date of discharge: 03/26/2024  Admitting diagnosis: Gestational hypertension [O13.9] Intrauterine pregnancy: [redacted]w[redacted]d     Secondary diagnosis:  Principal Problem:   Gestational hypertension  Additional problems: ***    Discharge diagnosis: {DX.:23714}                                              Post partum procedures:{Postpartum procedures:23558} Augmentation: {Augmentation:20782} Complications: {OB Labor/Delivery Complications:20784}  Hospital course: {Courses:23701}  Magnesium Sulfate received: {Mag received:30440022} BMZ received: {BMZ received:30440023} Rhophylac:{Rhophylac received:30440032} FFM:{FFM:69559966} T-DaP:{Tdap:23962} Flu: {Qol:76036} RSV Vaccine received: {RSV:31013} Transfusion:{Transfusion received:30440034}  Immunizations received: There is no immunization history for the selected administration types on file for this patient.  Physical exam  Vitals:   03/26/24 0700 03/26/24 0731 03/26/24 0741 03/26/24 0746  BP: 125/76 136/82 122/75 126/82  Pulse: (!) 106 100 (!) 102 (!) 105  Resp: 18     Temp:      TempSrc:      SpO2: 100%     Weight:      Height:       General: {Exam; general:21111117} Lochia: {Desc; appropriate/inappropriate:30686::appropriate} Uterine Fundus: {Desc; firm/soft:30687} Incision: {Exam; incision:21111123} DVT Evaluation: {Exam; dvt:2111122} Labs: Lab Results  Component Value Date   WBC 12.2 (H) 03/25/2024   HGB 12.9 03/25/2024   HCT 36.4 03/25/2024   MCV 92.4 03/25/2024   PLT 253 03/25/2024      Latest Ref Rng & Units 03/25/2024    4:34 PM  CMP  Glucose 70 - 99 mg/dL 97   BUN 6 - 20 mg/dL 9   Creatinine 9.55 - 8.99 mg/dL 9.12   Sodium 864 - 854 mmol/L 137   Potassium 3.5 - 5.1 mmol/L 3.7    Chloride 98 - 111 mmol/L 104   CO2 22 - 32 mmol/L 21   Calcium  8.9 - 10.3 mg/dL 9.1   Total Protein 6.5 - 8.1 g/dL 6.4   Total Bilirubin 0.0 - 1.2 mg/dL 0.5   Alkaline Phos 38 - 126 U/L 426   AST 15 - 41 U/L 19   ALT 0 - 44 U/L 13    Edinburgh Score:    02/28/2019   11:53 AM  Edinburgh Postnatal Depression Scale Screening Tool  I have been able to laugh and see the funny side of things. 0   I have looked forward with enjoyment to things. 0   I have blamed myself unnecessarily when things went wrong. 0   I have been anxious or worried for no good reason. 0   I have felt scared or panicky for no good reason. 0   Things have been getting on top of me. 0   I have been so unhappy that I have had difficulty sleeping. 0   I have felt sad or miserable. 0   I have been so unhappy that I have been crying. 0   The thought of harming myself has occurred to me. 0   Edinburgh Postnatal Depression Scale Total 0      Data saved with a previous flowsheet row definition   No data recorded  After visit meds:  Allergies as of 03/26/2024   No Known Allergies   Med  Rec must be completed prior to using this [facility]***        Discharge home in stable condition Infant Feeding: {Baby feeding:23562} Infant Disposition:{CHL IP OB HOME WITH FNUYZM:76418} Discharge instruction: per After Visit Summary and Postpartum booklet. Activity: Advance as tolerated. Pelvic rest for 6 weeks.  Diet: {OB ipzu:78888878} Future Appointments: Future Appointments  Date Time Provider Department Center  03/30/2024  9:15 AM [facility]  [facility] None  03/30/2024 10:10 AM Marilynn Nest, DO [facility]  04/04/2024  6:30 AM [facility] SCHED ROOM [facility] None  04/04/2024  2:45 PM [facility] PROVIDER 1 [facility]  04/04/2024  3:00 PM [facility] US1 [facility]  04/06/2024  1:50 PM Marilynn Nest, DO [facility]   Follow up Visit:   Please schedule this patient for a {Visit type:23955} postpartum visit in  {Postpartum visit:23953} with the following provider: {Provider type:23954}. Additional Postpartum F/U:{PP Procedure:23957}  {Risk level:23960} pregnancy complicated by: {complication:23959} Delivery mode:  Vaginal, Spontaneous Anticipated Birth Control:  {Birth Control:23956}   03/26/2024 Claris CHRISTELLA Cedar, CNM

## 2024-03-26 NOTE — Anesthesia Procedure Notes (Signed)
 Combined Spinal Epidural Patient location during procedure: OB Start time: 03/26/2024 4:59 AM End time: 03/26/2024 5:09 AM  Staffing Anesthesiologist: Merla Almarie HERO, DO Performed: anesthesiologist   Preanesthetic Checklist Completed: patient identified, IV checked, site marked, risks and benefits discussed, surgical consent, monitors and equipment checked, pre-op evaluation and timeout performed  Epidural Patient position: sitting Prep: ChloraPrep and site prepped and draped Patient monitoring: heart rate, cardiac monitor, continuous pulse ox and blood pressure Approach: midline Location: L3-L4 Injection technique: LOR air  Needle:  Needle type: Pencan  Needle gauge: 24 G Needle length: 12.7 cm Needle insertion depth: 5.5 cm Needle type: Tuohy  Needle gauge: 17 G Needle length: 9 cm Needle insertion depth: 5.5 cm Catheter type: closed end flexible Catheter size: 19 Gauge Catheter at skin depth: 11 cm  Additional Notes Patient identified. Risks/Benefits/Options discussed with patient including but not limited to bleeding, infection, nerve damage, paralysis, failed block, incomplete pain control, headache, blood pressure changes, nausea, vomiting, reactions to medication both or allergic, itching and postpartum back pain. Confirmed with bedside nurse the patient's most recent platelet count. Confirmed with patient that they are not currently taking any anticoagulation, have any bleeding history or any family history of bleeding disorders. Patient expressed understanding and wished to proceed. All questions were answered. Sterile technique was used throughout the entire procedure. Please see nursing notes for vital signs. Test dose was given through epidural catheter and negative prior to continuing to dose epidural or start infusion. Warning signs of high block given to the patient including shortness of breath, tingling/numbness in hands, complete motor block, or any concerning  symptoms with instructions to call for help. Patient was given instructions on fall risk and not to get out of bed. All questions and concerns addressed with instructions to call with any issues or inadequate analgesia.   Reason for block:procedure for pain

## 2024-03-26 NOTE — Progress Notes (Signed)
 Labor Progress Note Tanya Gregory is a 43 y.o. H1E6866 at [redacted]w[redacted]d presented for IOL due to newly diagnosed gHTN.   S: Doing well.  O:  BP 119/79   Pulse 92   Temp 98.7 F (37.1 C) (Oral)   Resp 18   Ht 5' 5 (1.651 m)   Wt 91.6 kg   LMP 07/06/2023   SpO2 99%   BMI 33.60 kg/m  EFM: 130/Moderate Variability/Accelerations (+),Decelerations (-)  [name]: Dilation: 6.5 Effacement (%): 80 Cervical Position: Posterior Station: -1 Presentation: Vertex Exam by:: [name]   A&P: 44 y.o. H1E6866 [redacted]w[redacted]d  #Labor: Progressing well. FB came out. AROM performed. Clear fluid.  #Pain: Per patient request #FWB: Category I #GBS negative #gHTN: Continue to monitor BP's. PIH labs negative. Consider lasix/Kcl postpartum  [name], MD 3:25 AM

## 2024-03-27 ENCOUNTER — Inpatient Hospital Stay (HOSPITAL_COMMUNITY)

## 2024-03-27 ENCOUNTER — Other Ambulatory Visit (HOSPITAL_COMMUNITY): Payer: Self-pay

## 2024-03-27 DIAGNOSIS — O34219 Maternal care for unspecified type scar from previous cesarean delivery: Secondary | ICD-10-CM | POA: Diagnosis not present

## 2024-03-27 DIAGNOSIS — M7989 Other specified soft tissue disorders: Secondary | ICD-10-CM | POA: Diagnosis not present

## 2024-03-27 DIAGNOSIS — Z8759 Personal history of other complications of pregnancy, childbirth and the puerperium: Secondary | ICD-10-CM

## 2024-03-27 MED ORDER — SENNOSIDES-DOCUSATE SODIUM 8.6-50 MG PO TABS
2.0000 | ORAL_TABLET | Freq: Every evening | ORAL | 0 refills | Status: AC | PRN
  Filled 2024-03-27: qty 30, 15d supply, fill #0

## 2024-03-27 MED ORDER — POTASSIUM CHLORIDE 20 MEQ PO PACK
20.0000 meq | PACK | Freq: Every day | ORAL | 0 refills | Status: AC
  Filled 2024-03-27: qty 3, 3d supply, fill #0

## 2024-03-27 MED ORDER — FUROSEMIDE 20 MG PO TABS
20.0000 mg | ORAL_TABLET | Freq: Every day | ORAL | 0 refills | Status: AC
  Filled 2024-03-27: qty 3, 3d supply, fill #0

## 2024-03-27 MED ORDER — ACETAMINOPHEN 500 MG PO TABS
1000.0000 mg | ORAL_TABLET | Freq: Four times a day (QID) | ORAL | 0 refills | Status: AC | PRN
  Filled 2024-03-27: qty 60, 8d supply, fill #0

## 2024-03-27 MED ORDER — IBUPROFEN 600 MG PO TABS
600.0000 mg | ORAL_TABLET | Freq: Four times a day (QID) | ORAL | 0 refills | Status: AC
  Filled 2024-03-27: qty 30, 8d supply, fill #0

## 2024-03-27 NOTE — Patient Instructions (Signed)

## 2024-03-27 NOTE — Lactation Note (Signed)
 This note was copied from a baby's chart. Lactation Consultation Note  Patient Name: Tanya Gregory Unijb'd Date: 03/27/2024 Age:41 hours, P4 experienced , its been 5 years  Reason for consult: Follow-up assessment Per mom attempted to latch at 1 pm and he been sleepy from the circ.  LC reviewed breast feeding D/C teaching and stressed the importance of  breast engorgement and tx .  Per mom breast are fuller today and hearing swallows.  Mom aware of the [facility] resources and LC provided and hand pump and flanges.   Maternal Data Has patient been taught Hand Expression?: Yes Does the patient have breastfeeding experience prior to this delivery?: Yes  Feeding Mother's Current Feeding Choice: Breast Milk  LATCH Score Latch:  (per mom recently attempted, sleepy) Latch score 9    Lactation Tools Discussed/Used Tools: Pump;Flanges Flange Size: 21;24;27 Breast pump type: Manual Pump Education: Setup, frequency, and cleaning;Milk Storage Reason for Pumping: with steps for latching and PRN  Interventions Interventions: Breast feeding basics reviewed;Reverse pressure;Hand pump;Ice;LC Services brochure;CDC milk storage guidelines;CDC Guidelines for Breast Pump Cleaning  Discharge Discharge Education: Engorgement and breast care;Warning signs for feeding baby;Outpatient recommendation (if needed) Pump: Hands Free;Manual;Personal  Consult Status Consult Status: Complete Date: 03/27/24    Tanya Gregory 03/27/2024, 2:19 PM

## 2024-03-30 ENCOUNTER — Other Ambulatory Visit

## 2024-03-30 ENCOUNTER — Encounter: Admitting: Obstetrics & Gynecology

## 2024-04-01 ENCOUNTER — Encounter (HOSPITAL_COMMUNITY): Payer: Self-pay

## 2024-04-02 NOTE — Lactation Note (Signed)
 This note was copied from a baby's chart. Lactation Consultation Note  Patient Name: Tanya Gregory Date: 04/02/2024 Age:41 days Reason for consult: Follow-up assessment;Other (Comment);Early term 39-38.6wks;Hyperbilirubinemia;Infant < 6lbs (gHTN, AMA, Peds re-admission)  Visited with family of 87 77/10 weeks old female; baby Tanya Gregory was readmitted due to hyperbilirubinemia. He's been exclusively breastfeeding, but Tanya Gregory also voiced that he has taken a bottle at home. She had questions about how fast the bottle nipple should be; she said baby hasn't been taking the breast as much since she introduced that bottle. Discussed flow rate handout and provided extra slow flow nipples (she used the Lansinoh slow flow and they were too fast).  Baby sleepy but started to stir up. She took him to the R breast in biological position and he was able to latch. She wondered if her nipples were too big for baby's mouth (she needs at least a # 27 flange) she has a great technique and compresses her nipple so it can fit inside baby's mouth. After a few attempts he latched with swallows noted but only for 2 minutes (see LATCH score). Discussed the importance of supplementation with her own breastmilk (since she's also pumping) to keep bilirubin levels WNL; he just got off the lights this morning.   Tanya Gregory is expecting to take baby Tanya Gregory home today. Reviewed discharge education and the importance of having at least +8 feedings/24 hours or sooner if feeding cues are present, she'll follow with a bottle of her EBM using an extra slow nipple flow. She'll pump after feedings/attempts at the breast if her breast feel full. No other support person at this time. All questions and concerns answered, family to contact [facility] services PRN.  Feeding Mother's Current Feeding Choice: Breast Milk  LATCH Score Latch: Repeated attempts needed to sustain latch, nipple held in mouth throughout feeding, stimulation  needed to elicit sucking reflex.  Audible Swallowing: Spontaneous and intermittent  Type of Nipple: Everted at rest and after stimulation  Comfort (Breast/Nipple): Soft / non-tender  Hold (Positioning): No assistance needed to correctly position infant at breast.  LATCH Score: 9   Lactation Tools Discussed/Used Tools: Pump;Flanges Flange Size: 27 Breast pump type: Double-Electric Breast Pump Pump Education: Setup, frequency, and cleaning;Milk Storage Reason for Pumping: Peds readmission Pumping frequency: 1 time/24 hours Pumped volume: 180 mL  Interventions Interventions: Breast feeding basics reviewed;Assisted with latch;Breast compression;Adjust position;Support pillows;DEBP;Education  Discharge Discharge Education: Engorgement and breast care;Warning signs for feeding baby;Outpatient recommendation Pump: Personal;DEBP (Motif (new) and Spectra  S2 from previous baby)  Consult Status Consult Status: Complete Date: 04/02/24 Follow-up type: Call as needed   Maresha Anastos GORMAN Crate 04/02/2024, 12:15 PM

## 2024-04-04 ENCOUNTER — Inpatient Hospital Stay (HOSPITAL_COMMUNITY)

## 2024-04-04 ENCOUNTER — Ambulatory Visit

## 2024-04-04 ENCOUNTER — Inpatient Hospital Stay (HOSPITAL_COMMUNITY): Admission: RE | Admit: 2024-04-04 | Source: Home / Self Care | Admitting: Obstetrics & Gynecology

## 2024-04-06 ENCOUNTER — Encounter: Admitting: Obstetrics & Gynecology

## 2024-04-06 ENCOUNTER — Encounter: Payer: Self-pay | Admitting: Lactation Services

## 2024-04-11 ENCOUNTER — Telehealth (HOSPITAL_COMMUNITY): Payer: Self-pay | Admitting: *Deleted

## 2024-04-11 NOTE — Telephone Encounter (Signed)
 04/11/2024  Name: Tanya Gregory MRN: [id number] DOB: 1981-04-12  Reason for Call:  Transition of Care Hospital Discharge Call  Contact Status: Patient Contact Status: Complete  Language assistant needed:          Follow-Up Questions: Do You Have Any Concerns About Your Health As You Heal From Delivery?: No Do You Have Any Concerns About Your Infants Health?: No  Edinburgh Postnatal Depression Scale:  In the Past 7 Days:  Patient reported that her answers are the same as when she completed the EPDS in the hospital on 03/27/24. Score at that time was 0. Stated that she is "doing well." EPDS not completed at this time  PHQ2-9 Depression Scale:     Discharge Follow-up:  Informed patient of hospital postpartum classes and support groups. Patient requested email information - sent by RN.   Post-discharge interventions: Reviewed Newborn Safe Sleep Practices  Signature Allean IVAR Carton, RN, 04/11/24

## 2024-05-03 ENCOUNTER — Encounter: Payer: Self-pay | Admitting: Obstetrics & Gynecology

## 2024-05-03 ENCOUNTER — Ambulatory Visit: Admitting: Obstetrics & Gynecology

## 2024-05-03 NOTE — Progress Notes (Signed)
 POSTPARTUM VISIT Patient name: Tanya Gregory MRN [id number]  Date of birth: 1980/10/23 Chief Complaint:   Postpartum Care  History of Present Illness:   Tanya Gregory is a 43 y.o. H1E5865 female being seen today for a postpartum visit. She is 5 weeks postpartum following a vaginal birth after cesarean (VBAC) at [redacted]w[redacted]d gestational weeks. IOL: Yes, for Gestational hypertension.    Pregnancy complicated by TOLAC, AMA, Gest HTN.  S/p Lasix  x 5 days.  Currently asymptomatic.  Last pap smear: 04/2023    Postpartum course has been uncomplicated.  Bleeding no bleeding. Bowel function is normal. Bladder function is normal. Urinary incontinence? No, fecal incontinence? No Patient is not sexually active. Last sexual activity: prior to delivery.   Desired contraception: natural family planning. Patient does not know want a pregnancy in the future.   Desired family size is not sure children.   Upstream - 05/03/24 1111       Pregnancy Intention Screening   Does the patient want to become pregnant in the next year? No    Does the patient's partner want to become pregnant in the next year? No    Would the patient like to discuss contraceptive options today? No      Contraception Wrap Up   Current Method Abstinence    End Method No Contraception Precautions    Contraception Counseling Provided No         The pregnancy intention screening data noted above was reviewed. Potential methods of contraception were discussed. The patient elected to proceed with No Contraception Precautions.  Edinburgh Postpartum Depression Screening: Negative  Edinburgh Postnatal Depression Scale - 05/03/24 1114       Edinburgh Postnatal Depression Scale:  In the Past 7 Days   I have been able to laugh and see the funny side of things. 0    I have looked forward with enjoyment to things. 0    I have blamed myself unnecessarily when things went wrong. 0    I have been anxious or worried for no good  reason. 2    I have felt scared or panicky for no good reason. 0    Things have been getting on top of me. 1    I have been so unhappy that I have had difficulty sleeping. 0    I have felt sad or miserable. 0    I have been so unhappy that I have been crying. 1    The thought of harming myself has occurred to me. 0    Edinburgh Postnatal Depression Scale Total 4          Baby's course has been uncomplicated. Baby is feeding by breast: milk supply adequate. Infant has a pediatrician/family doctor? Yes.  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  Pt has material needs met for her and baby: Yes.    Review of Systems:   Pertinent items are noted in HPI Denies Abnormal vaginal discharge w/ itching/odor/irritation, headaches, visual changes, shortness of breath, chest pain, abdominal pain, severe nausea/vomiting, or problems with urination or bowel movements. Pertinent History Reviewed:  Reviewed past medical,surgical, obstetrical and family history.  Reviewed problem list, medications and allergies. OB History  Gravida Para Term Preterm AB Living  8 5 4 1 3 4   SAB IAB Ectopic Multiple Live Births  1 1 1 1 4     # Outcome Date GA Lbr Len/2nd Weight Sex Type Anes PTL Lv  8 Gravida  7A SAB 03/2022     SAB     7B Term 03/26/24 [redacted]w[redacted]d 20:59 / 00:26 6 lb 3.5 oz (2.82 kg) M VBAC EPI  LIV  6 Term 02/27/19 [redacted]w[redacted]d  8 lb 3.9 oz (3.739 kg) M CS-Vac Spinal  LIV  5 Term 08/02/17 [redacted]w[redacted]d    Vag-Spont   LIV  4 Ectopic 2018          3 IAB 2013          2 Term 10/2009 [redacted]w[redacted]d   F Vag-Spont   LIV     Birth Comments: pre eclampsia  1 Preterm 05/27/99 [redacted]w[redacted]d    SAB   FD    Obstetric Comments  1st SAB at 4 months   Physical Assessment:   Vitals:   05/03/24 1110  BP: 126/83  Pulse: 81  Weight: 176 lb 9.6 oz (80.1 kg)  Height: 5' 5 (1.651 m)  Body mass index is 29.39 kg/m.       Physical Examination:   General appearance: alert, well appearing, and in no  distress  Mental status: normal mood, behavior, speech, dress, motor activity, and thought processes  Skin: warm & dry   Cardiovascular: RRR  Respiratory: CTAB  Breasts: no masses or abnormalities noted, no evidence of infection  Abdomen: soft, non-tender   Pelvic: VULVA: normal appearing vulva with no masses, tenderness or lesions, VAGINA: normal appearing vagina with normal color and discharge, no lesions, CERVIX: normal appearing cervix without discharge or lesions  Extremities: 1+ edema, left> right- improved, but still present.  No erythema or calf tenderness bilaterally  Chaperone: pt declined          Assessment & Plan:  1) Postpartum exam 2) 5 wks s/p vaginal birth after cesarean (VBAC) 3) breast feeding having difficulty with latching.  Patient is scheduled to see pediatric specialist.  Also discussed OMM/chiropracter 4) Depression screening 5) Contraception management: Natural family-planning  Essential components of care per ACOG recommendations:  1.  Mood and well being:  If positive depression screen, discussed and plan developed.  If using tobacco we discussed reduction/cessation and risk of relapse If current substance abuse, we discussed and referral to local resources was offered.   2. Infant care and feeding:  If breastfeeding, discussed returning to work, pumping, breastfeeding-associated pain, guidance regarding return to fertility while lactating if not using another method Recommended that all caregivers be immunized for flu, pertussis and other preventable communicable diseases  3. Sexuality, contraception and birth spacing Provided guidance regarding sexuality, management of dyspareunia, and resumption of intercourse Discussed avoiding interpregnancy interval <49mths and recommended birth spacing of 18 months  4. Sleep and fatigue Discussed coping options for fatigue and sleep disruption Encouraged family/partner/community support of 4 hrs of uninterrupted  sleep to help with mood and fatigue  5. Physical recovery  Encouraged pelvic floor exercises Patient is safe to resume physical activity. Discussed attainment of healthy weight.  6.  Chronic disease management Discussed pregnancy complications if any, and their implications for future childbearing and long-term maternal health.  7. Health maintenance Mammogram at 40yo or earlier if indicated Pap smears up to date, last completed 04/2023  Meds: No orders of the defined types were placed in this encounter.   Follow-up: Return in about 1 year (around 05/03/2025) for Annual.   No orders of the defined types were placed in this encounter.   Miranda Garber, DO Attending Obstetrician & Gynecologist, [facility] for [employer], [facility]
